# Patient Record
Sex: Male | Born: 1940 | Race: White | Hispanic: No | Smoking: Never smoker
Health system: Southern US, Community
[De-identification: ages and names within clinical notes are randomized; demographics above are authoritative.]

## PROBLEM LIST (undated history)

## (undated) DIAGNOSIS — K219 Gastro-esophageal reflux disease without esophagitis: Secondary | ICD-10-CM

## (undated) DIAGNOSIS — N200 Calculus of kidney: Secondary | ICD-10-CM

## (undated) DIAGNOSIS — Z87442 Personal history of urinary calculi: Secondary | ICD-10-CM

## (undated) DIAGNOSIS — E785 Hyperlipidemia, unspecified: Secondary | ICD-10-CM

## (undated) DIAGNOSIS — K449 Diaphragmatic hernia without obstruction or gangrene: Secondary | ICD-10-CM

## (undated) HISTORY — PX: ORIF TIBIA & FIBULA FRACTURES: SHX2131

---

## 2000-06-01 ENCOUNTER — Emergency Department (HOSPITAL_COMMUNITY): Admission: EM | Admit: 2000-06-01 | Discharge: 2000-06-01 | Payer: Self-pay | Admitting: Emergency Medicine

## 2001-05-22 ENCOUNTER — Ambulatory Visit (HOSPITAL_COMMUNITY): Admission: RE | Admit: 2001-05-22 | Discharge: 2001-05-22 | Payer: Self-pay | Admitting: Gastroenterology

## 2002-03-13 ENCOUNTER — Encounter: Payer: Self-pay | Admitting: Family Medicine

## 2002-03-13 ENCOUNTER — Encounter: Admission: RE | Admit: 2002-03-13 | Discharge: 2002-03-13 | Payer: Self-pay | Admitting: Family Medicine

## 2002-09-13 ENCOUNTER — Encounter: Payer: Self-pay | Admitting: Family Medicine

## 2002-09-13 ENCOUNTER — Encounter: Admission: RE | Admit: 2002-09-13 | Discharge: 2002-09-13 | Payer: Self-pay | Admitting: Family Medicine

## 2004-06-07 ENCOUNTER — Ambulatory Visit (HOSPITAL_COMMUNITY): Admission: RE | Admit: 2004-06-07 | Discharge: 2004-06-07 | Payer: Self-pay | Admitting: *Deleted

## 2005-09-16 ENCOUNTER — Encounter: Admission: RE | Admit: 2005-09-16 | Discharge: 2005-09-16 | Payer: Self-pay | Admitting: Gastroenterology

## 2006-03-21 ENCOUNTER — Encounter: Admission: RE | Admit: 2006-03-21 | Discharge: 2006-03-21 | Payer: Self-pay | Admitting: Gastroenterology

## 2011-01-16 ENCOUNTER — Emergency Department (HOSPITAL_COMMUNITY): Payer: Medicare Other

## 2011-01-16 ENCOUNTER — Emergency Department (HOSPITAL_COMMUNITY)
Admission: EM | Admit: 2011-01-16 | Discharge: 2011-01-16 | Disposition: A | Discharge status: Home / Self Care | Payer: Medicare Other | Attending: Emergency Medicine | Admitting: Emergency Medicine

## 2011-01-16 ENCOUNTER — Encounter: Payer: Self-pay | Admitting: *Deleted

## 2011-01-16 DIAGNOSIS — N200 Calculus of kidney: Secondary | ICD-10-CM | POA: Insufficient documentation

## 2011-01-16 DIAGNOSIS — Z79899 Other long term (current) drug therapy: Secondary | ICD-10-CM | POA: Insufficient documentation

## 2011-01-16 DIAGNOSIS — N201 Calculus of ureter: Secondary | ICD-10-CM | POA: Insufficient documentation

## 2011-01-16 DIAGNOSIS — K219 Gastro-esophageal reflux disease without esophagitis: Secondary | ICD-10-CM | POA: Insufficient documentation

## 2011-01-16 DIAGNOSIS — E785 Hyperlipidemia, unspecified: Secondary | ICD-10-CM | POA: Insufficient documentation

## 2011-01-16 DIAGNOSIS — R110 Nausea: Secondary | ICD-10-CM | POA: Insufficient documentation

## 2011-01-16 DIAGNOSIS — R109 Unspecified abdominal pain: Secondary | ICD-10-CM | POA: Insufficient documentation

## 2011-01-16 HISTORY — DX: Calculus of kidney: N20.0

## 2011-01-16 HISTORY — DX: Gastro-esophageal reflux disease without esophagitis: K21.9

## 2011-01-16 HISTORY — DX: Hyperlipidemia, unspecified: E78.5

## 2011-01-16 HISTORY — DX: Diaphragmatic hernia without obstruction or gangrene: K44.9

## 2011-01-16 LAB — BASIC METABOLIC PANEL
Calcium: 8.4 mg/dL (ref 8.4–10.5)
GFR calc Af Amer: 80 mL/min — ABNORMAL LOW (ref >90)
GFR calc non Af Amer: 69 mL/min — ABNORMAL LOW (ref >90)
Glucose, Bld: 107 mg/dL — ABNORMAL HIGH (ref 70–99)
Potassium: 4 mEq/L (ref 3.5–5.1)
Sodium: 141 mEq/L (ref 135–145)

## 2011-01-16 LAB — URINALYSIS, ROUTINE W REFLEX MICROSCOPIC
Glucose, UA: NEGATIVE mg/dL
Specific Gravity, Urine: 1.029 (ref 1.005–1.030)
pH: 5.5 (ref 5.0–8.0)

## 2011-01-16 LAB — URINE MICROSCOPIC-ADD ON

## 2011-01-16 MED ORDER — OXYCODONE-ACETAMINOPHEN 5-325 MG PO TABS: 1.0000 | ORAL_TABLET | ORAL | Status: AC | PRN

## 2011-01-16 MED ORDER — SODIUM CHLORIDE 0.9 % IV BOLUS (SEPSIS)
1000.0000 mL | Freq: Once | INTRAVENOUS | Status: AC
  Administered 2011-01-16: 1000 mL via INTRAVENOUS

## 2011-01-16 MED ORDER — HYDROMORPHONE HCL PF 1 MG/ML IJ SOLN
1.0000 mg | Freq: Once | INTRAMUSCULAR | Status: AC
  Administered 2011-01-16: 1 mg via INTRAVENOUS
  Filled 2011-01-16: qty 1

## 2011-01-16 MED ORDER — TAMSULOSIN HCL 0.4 MG PO CAPS: 0.4000 mg | ORAL_CAPSULE | Freq: Every day | ORAL | Status: DC

## 2011-01-16 MED ORDER — IBUPROFEN 400 MG PO TABS: 400.0000 mg | ORAL_TABLET | Freq: Three times a day (TID) | ORAL | Status: AC | PRN

## 2011-01-16 MED ORDER — KETOROLAC TROMETHAMINE 30 MG/ML IJ SOLN
30.0000 mg | Freq: Once | INTRAMUSCULAR | Status: AC
  Administered 2011-01-16: 30 mg via INTRAVENOUS
  Filled 2011-01-16: qty 1

## 2011-01-16 NOTE — ED Notes (Signed)
Flank pain that radiates to his groin. Hx of kidney stones...feels like he has same pain.

## 2011-01-16 NOTE — ED Notes (Signed)
Patient transported to CT 

## 2011-01-16 NOTE — ED Notes (Signed)
Presents with left lower quad pain radiating into groin x several hours. Denies urinary sx. Hx of kidney stones.

## 2011-01-16 NOTE — ED Provider Notes (Signed)
History     CSN: [id number] Arrival date & time: 01/16/2011 11:44 AM   First MD Initiated Contact with Patient 01/16/11 1320      Chief Complaint  Patient presents with  . Flank Pain    (Consider location/radiation/quality/duration/timing/severity/associated sxs/prior treatment) Patient is a 70 y.o. male presenting with flank pain. The history is provided by the patient.  Flank Pain   patient reports acute onset colicky left-sided abdominal pain.  He reports that radiates up to his left flank and down into his left groin.  Reports approximately 10 years ago he had a right-sided kidney stone is very similar to this.  This occurred abruptly today.  He's had no trauma.  Reports nausea without vomiting.  Denies diarrhea.  He has no known history per the patient of the abdominal aortic aneurysm.  His had no lightheadedness or syncope.  He denies shortness of breath or cough.  He denies fevers or chills.  He denies dysuria or urinary frequency.  Nothing worsens the symptoms.  Nothing improves the symptoms.  Symptoms are severe.  Past Medical History  Diagnosis Date  . Kidney calculi   . Hyperlipemia   . Hiatal hernia   . GERD (gastroesophageal reflux disease)     Past Surgical History  Procedure Date  . Orif tibia & fibula fractures     History reviewed. No pertinent family history.  History  Substance Use Topics  . Smoking status: Never Smoker   . Smokeless tobacco: Not on file  . Alcohol Use: No      Review of Systems  Genitourinary: Positive for flank pain.  All other systems reviewed and are negative.    Allergies  Review of patient's allergies indicates no known allergies.  Home Medications   Current Outpatient Rx  Name Route Sig Dispense Refill  . BC HEADACHE POWDER PO Oral Take 1 packet by mouth every morning.      Marland Kitchen VITAMIN B-12 PO Oral Take 1 tablet by mouth daily.      Carma Leaven M PLUS PO TABS Oral Take 1 tablet by mouth daily.      Marland Kitchen OMEPRAZOLE 40 MG PO  CPDR Oral Take 40 mg by mouth daily.      . RED YEAST RICE PO Oral Take 1 tablet by mouth daily.      Marland Kitchen SIMVASTATIN 80 MG PO TABS Oral Take 80 mg by mouth at bedtime.        BP 184/95  Pulse 71  Temp(Src) 98 F (36.7 C) (Oral)  Resp 18  SpO2 98%  Physical Exam  Nursing note and vitals reviewed. Constitutional: He is oriented to person, place, and time. He appears well-developed and well-nourished.  HENT:  Head: Normocephalic and atraumatic.  Eyes: EOM are normal.  Neck: Normal range of motion.  Cardiovascular: Normal rate, regular rhythm, normal heart sounds and intact distal pulses.   Pulmonary/Chest: Effort normal and breath sounds normal. No respiratory distress.  Abdominal: Soft. He exhibits no distension. There is no tenderness.       No palpable abdominal mass or pulsatile mass  Musculoskeletal: Normal range of motion.       No CVA tenderness  Neurological: He is alert and oriented to person, place, and time.  Skin: Skin is warm and dry.  Psychiatric: He has a normal mood and affect. Judgment normal.    ED Course  Procedures (including critical care time)  Labs Reviewed  URINALYSIS, ROUTINE W REFLEX MICROSCOPIC - Abnormal; Notable for the following:  Appearance HAZY (*)    Hgb urine dipstick LARGE (*)    Bilirubin Urine SMALL (*)    All other components within normal limits  URINE MICROSCOPIC-ADD ON  BASIC METABOLIC PANEL   Ct Abdomen Pelvis Wo Contrast  01/16/2011  *RADIOLOGY REPORT*  Clinical Data: Left flank pain  CT ABDOMEN AND PELVIS WITHOUT CONTRAST  Technique:  Multidetector CT imaging of the abdomen and pelvis was performed following the standard protocol without intravenous contrast.  Comparison: None.  Findings: Mild plate-like atelectasis versus scarring at the right lung base.  Unenhanced liver, spleen, pancreas, and adrenal glands are within normal limits.  Gallbladder is unremarkable.  No intrahepatic or extrahepatic ductal dilatation.  3 mm  nonobstructing right upper pole calculus (series 2/image 32). Two small right lower pole cysts, measuring up to 12 mm (series 2/image 38).  No hydronephrosis.  Two 3 mm nonobstructing left upper pole renal calculi (series 2/images 33 and 38). 1.1 cm left lower pole cyst.  No hydronephrosis.  No evidence of bowel obstruction.  Normal appendix.  Colonic diverticulosis, without associated inflammatory changes.  Atherosclerotic calcifications of the abdominal aorta and branch vessels.  No abdominopelvic ascites.  No suspicious abdominopelvic lymphadenopathy.  Prostate is top normal in size, measuring 4.6 cm.  Periureteral stranding surrounding the mid left ureter (series 2/image 55).  2 mm left distal ureteral calculus (series 2/image 79).  No bladder calculi.  Visualized osseous structures are within normal limits.  IMPRESSION: 2 mm calculus in the distal left ureter.  Surrounding periureteral stranding.  No hydronephrosis.  Three additional 3 mm nonobstructing bilateral renal calculi.  Original Report Authenticated By: Charline Bills, M.D.     1. Ureteral stone       MDM  Suspect left-sided ureteral stone.  Pain treated.  CT scan pending at this time.  We'll reevaluate  2:00 PM Left ureteral stone on CT scan.  Patient reports significant improvement in his pain.  Discharge home with urology followup        Lyanne Co, MD 01/16/11 1400

## 2013-10-25 ENCOUNTER — Other Ambulatory Visit: Payer: Self-pay | Admitting: Family Medicine

## 2013-10-25 DIAGNOSIS — N63 Unspecified lump in unspecified breast: Secondary | ICD-10-CM

## 2013-10-30 ENCOUNTER — Ambulatory Visit
Admission: RE | Admit: 2013-10-30 | Discharge: 2013-10-30 | Disposition: A | Discharge status: Home / Self Care | Payer: Medicare Other | Source: Ambulatory Visit | Attending: Family Medicine | Admitting: Family Medicine

## 2013-10-30 DIAGNOSIS — N63 Unspecified lump in unspecified breast: Secondary | ICD-10-CM

## 2013-11-07 ENCOUNTER — Ambulatory Visit
Admission: RE | Admit: 2013-11-07 | Discharge: 2013-11-07 | Disposition: A | Discharge status: Home / Self Care | Payer: Medicare Other | Source: Ambulatory Visit | Attending: Family Medicine | Admitting: Family Medicine

## 2013-11-07 ENCOUNTER — Other Ambulatory Visit: Payer: Self-pay | Admitting: Family Medicine

## 2013-11-07 DIAGNOSIS — R05 Cough: Secondary | ICD-10-CM

## 2013-11-07 DIAGNOSIS — R059 Cough, unspecified: Secondary | ICD-10-CM

## 2014-11-14 ENCOUNTER — Other Ambulatory Visit: Payer: Self-pay | Admitting: Gastroenterology

## 2019-07-02 ENCOUNTER — Other Ambulatory Visit: Payer: Self-pay

## 2019-07-02 ENCOUNTER — Emergency Department (HOSPITAL_COMMUNITY): Payer: Medicare Other

## 2019-07-02 ENCOUNTER — Encounter (HOSPITAL_COMMUNITY): Payer: Self-pay

## 2019-07-02 ENCOUNTER — Emergency Department (HOSPITAL_COMMUNITY)
Admission: EM | Admit: 2019-07-02 | Discharge: 2019-07-02 | Disposition: A | Discharge status: Home / Self Care | Payer: Medicare Other | Attending: Emergency Medicine | Admitting: Emergency Medicine

## 2019-07-02 DIAGNOSIS — Z79899 Other long term (current) drug therapy: Secondary | ICD-10-CM | POA: Insufficient documentation

## 2019-07-02 DIAGNOSIS — Z23 Encounter for immunization: Secondary | ICD-10-CM | POA: Insufficient documentation

## 2019-07-02 DIAGNOSIS — S31113A Laceration without foreign body of abdominal wall, right lower quadrant without penetration into peritoneal cavity, initial encounter: Secondary | ICD-10-CM | POA: Insufficient documentation

## 2019-07-02 DIAGNOSIS — Y93E1 Activity, personal bathing and showering: Secondary | ICD-10-CM | POA: Insufficient documentation

## 2019-07-02 DIAGNOSIS — S31119A Laceration without foreign body of abdominal wall, unspecified quadrant without penetration into peritoneal cavity, initial encounter: Secondary | ICD-10-CM

## 2019-07-02 DIAGNOSIS — Y999 Unspecified external cause status: Secondary | ICD-10-CM | POA: Diagnosis not present

## 2019-07-02 DIAGNOSIS — W01198A Fall on same level from slipping, tripping and stumbling with subsequent striking against other object, initial encounter: Secondary | ICD-10-CM | POA: Diagnosis not present

## 2019-07-02 DIAGNOSIS — Y92012 Bathroom of single-family (private) house as the place of occurrence of the external cause: Secondary | ICD-10-CM | POA: Diagnosis not present

## 2019-07-02 LAB — SAMPLE TO BLOOD BANK

## 2019-07-02 LAB — CBC WITH DIFFERENTIAL/PLATELET
Abs Immature Granulocytes: 0.01 10*3/uL (ref 0.00–0.07)
Basophils Absolute: 0 10*3/uL (ref 0.0–0.1)
Basophils Relative: 1 %
Eosinophils Absolute: 0.3 10*3/uL (ref 0.0–0.5)
Eosinophils Relative: 7 %
HCT: 41.3 % (ref 39.0–52.0)
Hemoglobin: 13.5 g/dL (ref 13.0–17.0)
Immature Granulocytes: 0 %
Lymphocytes Relative: 19 %
Lymphs Abs: 1 10*3/uL (ref 0.7–4.0)
MCH: 33.6 pg (ref 26.0–34.0)
MCHC: 32.7 g/dL (ref 30.0–36.0)
MCV: 102.7 fL — ABNORMAL HIGH (ref 80.0–100.0)
Monocytes Absolute: 0.4 10*3/uL (ref 0.1–1.0)
Monocytes Relative: 7 %
Neutro Abs: 3.3 10*3/uL (ref 1.7–7.7)
Neutrophils Relative %: 66 %
Platelets: 184 10*3/uL (ref 150–400)
RBC: 4.02 MIL/uL — ABNORMAL LOW (ref 4.22–5.81)
RDW: 13.9 % (ref 11.5–15.5)
WBC: 5 10*3/uL (ref 4.0–10.5)
nRBC: 0 % (ref 0.0–0.2)

## 2019-07-02 LAB — PROTIME-INR
INR: 1 (ref 0.8–1.2)
Prothrombin Time: 12.3 seconds (ref 11.4–15.2)

## 2019-07-02 LAB — URINALYSIS, ROUTINE W REFLEX MICROSCOPIC
Bilirubin Urine: NEGATIVE
Glucose, UA: NEGATIVE mg/dL
Hgb urine dipstick: NEGATIVE
Ketones, ur: NEGATIVE mg/dL
Leukocytes,Ua: NEGATIVE
Nitrite: NEGATIVE
Protein, ur: NEGATIVE mg/dL
Specific Gravity, Urine: 1.04 — ABNORMAL HIGH (ref 1.005–1.030)
pH: 5 (ref 5.0–8.0)

## 2019-07-02 LAB — I-STAT CHEM 8, ED
BUN: 25 mg/dL — ABNORMAL HIGH (ref 8–23)
Calcium, Ion: 1.15 mmol/L (ref 1.15–1.40)
Chloride: 110 mmol/L (ref 98–111)
Creatinine, Ser: 1.3 mg/dL — ABNORMAL HIGH (ref 0.61–1.24)
Glucose, Bld: 115 mg/dL — ABNORMAL HIGH (ref 70–99)
HCT: 40 % (ref 39.0–52.0)
Hemoglobin: 13.6 g/dL (ref 13.0–17.0)
Potassium: 3.5 mmol/L (ref 3.5–5.1)
Sodium: 144 mmol/L (ref 135–145)
TCO2: 24 mmol/L (ref 22–32)

## 2019-07-02 LAB — COMPREHENSIVE METABOLIC PANEL
ALT: 23 U/L (ref 0–44)
AST: 22 U/L (ref 15–41)
Albumin: 3.7 g/dL (ref 3.5–5.0)
Alkaline Phosphatase: 57 U/L (ref 38–126)
Anion gap: 7 (ref 5–15)
BUN: 20 mg/dL (ref 8–23)
CO2: 21 mmol/L — ABNORMAL LOW (ref 22–32)
Calcium: 9 mg/dL (ref 8.9–10.3)
Chloride: 113 mmol/L — ABNORMAL HIGH (ref 98–111)
Creatinine, Ser: 1.28 mg/dL — ABNORMAL HIGH (ref 0.61–1.24)
GFR calc Af Amer: >60 mL/min (ref >60)
GFR calc non Af Amer: 54 mL/min — ABNORMAL LOW (ref >60)
Glucose, Bld: 123 mg/dL — ABNORMAL HIGH (ref 70–99)
Potassium: 3.4 mmol/L — ABNORMAL LOW (ref 3.5–5.1)
Sodium: 141 mmol/L (ref 135–145)
Total Bilirubin: 0.4 mg/dL (ref 0.3–1.2)
Total Protein: 6.3 g/dL — ABNORMAL LOW (ref 6.5–8.1)

## 2019-07-02 MED ORDER — TETANUS-DIPHTH-ACELL PERTUSSIS 5-2.5-18.5 LF-MCG/0.5 IM SUSP
0.5000 mL | Freq: Once | INTRAMUSCULAR | Status: AC
  Administered 2019-07-02: 01:00:00 0.5 mL via INTRAMUSCULAR
  Filled 2019-07-02: qty 0.5

## 2019-07-02 MED ORDER — LIDOCAINE-EPINEPHRINE (PF) 2 %-1:200000 IJ SOLN
10.0000 mL | Freq: Once | INTRAMUSCULAR | Status: AC
  Administered 2019-07-02: 10 mL via INTRADERMAL
  Filled 2019-07-02: qty 20

## 2019-07-02 MED ORDER — HYDROCODONE-ACETAMINOPHEN 5-325 MG PO TABS
1.0000 | ORAL_TABLET | Freq: Once | ORAL | Status: AC
  Administered 2019-07-02: 04:00:00 1 via ORAL
  Filled 2019-07-02: qty 1

## 2019-07-02 MED ORDER — SODIUM CHLORIDE 0.9 % IV SOLN: INTRAVENOUS | Status: DC

## 2019-07-02 MED ORDER — IOHEXOL 300 MG/ML  SOLN
100.0000 mL | Freq: Once | INTRAMUSCULAR | Status: AC | PRN
  Administered 2019-07-02: 100 mL via INTRAVENOUS

## 2019-07-02 MED ORDER — ONDANSETRON 4 MG PO TBDP
4.0000 mg | ORAL_TABLET | Freq: Once | ORAL | Status: AC
  Administered 2019-07-02: 04:00:00 4 mg via ORAL
  Filled 2019-07-02: qty 1

## 2019-07-02 NOTE — Progress Notes (Signed)
Orthopedic Tech Progress Note Patient Details:  TYSHEEM ACCARDO [birth year] [id number] Level 2 Trauma  Patient ID: Paul Bauer, male   DOB: 1940/06/22, 79 y.o.   MRN: [id number]   Genelle Bal Clyda Smyth 07/02/2019, 1:23 AM

## 2019-07-02 NOTE — ED Notes (Signed)
Patient given urinal for urine collection.

## 2019-07-02 NOTE — ED Notes (Signed)
Trauma paged to Dr. Elesa Massed per her request

## 2019-07-02 NOTE — ED Notes (Signed)
Urine culture sent with u/a °

## 2019-07-02 NOTE — ED Triage Notes (Signed)
Pt was taking a shower and then fell, has penetrating wound to his R flank from faucet unknown is he hit his head, denies SOB.

## 2019-07-02 NOTE — ED Provider Notes (Signed)
Please see Dr. Jolyn Nap note for full H&P.   LACERATION REPAIR Performed by: Harvie Heck Authorized by: Harvie Heck Consent: Verbal consent obtained. Risks and benefits: risks, benefits and alternatives were discussed Consent given by: patient Patient identity confirmed: provided demographic data Prepped and Draped in normal sterile fashion Wound explored  Laceration Location: R flank/abdomen  Laceration Length: 3 cm  No Foreign Bodies seen or palpated  Anesthesia: local infiltration  Local anesthetic: lidocaine 2% wo epinephrine  Anesthetic total: 5 ml  Irrigation method: pressure irrigation Amount of cleaning: standard  Skin closure: sutures.   Number of sutures: 4  Technique: simple interrupted   Patient tolerance: Patient tolerated the procedure well with no immediate complications.    Cherly Anderson, PA-C 07/02/19 0447    Ward, Layla Maw, DO 07/02/19 [phone]

## 2019-07-02 NOTE — Discharge Instructions (Addendum)
Your CT scan showed no traumatic injury other than a superficial laceration to right abdomen that has been cleaned and repaired.  You do not need to be discharged on antibiotics.  Your tetanus vaccination has been updated today.  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  You will need to follow-up with your primary care doctor in 3 years to have an ultrasound to ensure no significant change with this aneurysm.  You may take Tylenol 1000 mg every 6 hours as needed for pain.  Your sutures will need to be removed in 10 days.  This can be done by your primary care physician or here in the emergency department.  You may clean this wound with warm soap and water gently.  You may apply over-the-counter Neosporin and a bandage to keep it clean and dry.

## 2019-07-02 NOTE — ED Notes (Signed)
Patient verbalized understanding of dc instructions, vss, ambulatory with nad.   

## 2019-07-02 NOTE — ED Provider Notes (Signed)
TIME SEEN: 1:14 AM  CHIEF COMPLAINT: Abdominal injury  HPI: Patient is a 79 year old male with history of hiatal hernia, GERD, hyperlipidemia, kidney stones who presents to the emergency department with right-sided abdominal/flank injury.  States that just prior to arrival he slipped in the shower and hit the right lateral abdomen on the faucet.  States that he cut his abdomen and the faucet was stuck on him and he had to pull it off the wall.  He denies that he hit his head.  He is not on blood thinners.  Unsure of last tetanus vaccination.  No other injury.  Denies significant pain.  No chest pain, shortness of breath or dizziness.  ROS: See HPI Constitutional: no fever  Eyes: no drainage  ENT: no runny nose   Cardiovascular:  no chest pain  Resp: no SOB  GI: no vomiting GU: no dysuria Integumentary: no rash  Allergy: no hives  Musculoskeletal: no leg swelling  Neurological: no slurred speech ROS otherwise negative  PAST MEDICAL HISTORY/PAST SURGICAL HISTORY:  Past Medical History:  Diagnosis Date  . GERD (gastroesophageal reflux disease)   . Hiatal hernia   . Hyperlipemia   . Kidney calculi     MEDICATIONS:  Prior to Admission medications   Medication Sig Start Date End Date Taking? Authorizing Provider  Aspirin-Salicylamide-Caffeine (BC HEADACHE POWDER PO) Take 1 packet by mouth every morning.      [provider]  Cyanocobalamin (VITAMIN B-12 PO) Take 1 tablet by mouth daily.      [provider]  Multiple Vitamins-Minerals (MULTIVITAMINS THER. W/MINERALS) TABS Take 1 tablet by mouth daily.      [provider]  omeprazole (PRILOSEC) 40 MG capsule Take 40 mg by mouth daily.      [provider]  Red Yeast Rice Extract (RED YEAST RICE PO) Take 1 tablet by mouth daily.      [provider]  simvastatin (ZOCOR) 80 MG tablet Take 80 mg by mouth at bedtime.      [provider]  Tamsulosin HCl (FLOMAX) 0.4 MG CAPS Take 1  capsule (0.4 mg total) by mouth daily. 01/16/11   Azalia Bilis, MD    ALLERGIES:  No Known Allergies  SOCIAL HISTORY:  Social History   Tobacco Use  . Smoking status: Never Smoker  Substance Use Topics  . Alcohol use: No    FAMILY HISTORY: No family history on file.  EXAM: BP (!) 150/84 (BP Location: Left Arm)   Pulse 78   Temp 98.1 F (36.7 C) (Oral)   Resp 17   SpO2 97%  CONSTITUTIONAL: Alert and oriented and responds appropriately to questions. Well-appearing; well-nourished HEAD: Normocephalic, atraumatic EYES: Conjunctivae clear, pupils appear equal, EOM appear intact ENT: normal nose; moist mucous membranes, normal nose, no dental injury NECK: Supple, normal ROM, no midline spinal tenderness or step-off or deformity CARD: RRR; S1 and S2 appreciated; no murmurs, no clicks, no rubs, no gallops CHEST: Nontender to palpation, no ecchymosis or crepitus or deformity RESP: Normal chest excursion without splinting or tachypnea; breath sounds clear and equal bilaterally; no wheezes, no rhonchi, no rales, no hypoxia or respiratory distress, speaking full sentences ABD/GI: Normal bowel sounds; non-distended; soft, patient has an approximately 3 cm open wound to the right flank.  When I probed this wound it seems to be approximately 1 cm deep but does not appear to go past the musculature.  There are no retained foreign bodies and no significant bleeding at this time.  No ecchymosis. BACK:  The back appears normal no midline spinal tenderness, step-off or deformity EXT: Normal ROM in all joints; no deformity noted, no edema; no cyanosis, extremities are nontender to palpation SKIN: Normal color for age and race; warm; no rash on exposed skin NEURO: Moves all extremities equally, normal speech, no facial asymmetry, normal sensation diffusely PSYCH: The patient's mood and manner are appropriate.   MEDICAL DECISION MAKING: Patient here with penetrating wound to the right  abdomen/flank.  He is hemodynamically stable currently.  Not on blood thinners.  No other sign of trauma.  I have probed the wound and it seems actually quite superficial however given this is a penetrating wound, will activate this as a level 2 trauma.  Will obtain labs, urine, CT of the abdomen pelvis with contrast.  He declines pain medication.  We will continue to closely monitor patient for signs of instability, pain.  Will update tetanus vaccination.  ED PROGRESS: Labs reviewed/interpreted and are reassuring.  Normal hemoglobin.  Creatinine minimally elevated at 1.28.  Last that we have for comparison was 1.07 in 2012.  He is receiving gentle IV hydration here.  No active bleeding.  Still hemodynamically stable with a benign abdomen.  CT of the abdomen pelvis reviewed/interpreted and shows laceration and soft tissue gas and stranding across the right flank compatible with his penetrating injury but there is no evidence of direct pleural or peritoneal violation.  No other acute traumatic findings within the abdomen pelvis.  Incidentally he does have diverticulosis without diverticulitis.  He also has bilateral nonobstructive nephrolithiasis.  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  Recommend follow-up in 3 years for this.  Have referred him to his primary care physician.  Will clean and repair patient's wound at bedside prior to discharge.  4:45 AM  Pt's urine reviewed/interpreted and shows no acute abnormality.  Wound has been cleaned and repaired.  Will discharge him with instructions to use Tylenol as needed for pain.  Wife here to drive him home.  At this time, I do not feel there is any life-threatening condition present. I have reviewed, interpreted and discussed all results (EKG, imaging, lab, urine as appropriate) and exam findings with patient/family. I have reviewed nursing notes and appropriate previous records.  I feel the  patient is safe to be discharged home without further emergent workup and can continue workup as an outpatient as needed. Discussed usual and customary return precautions. Patient/family verbalize understanding and are comfortable with this plan.  Outpatient follow-up has been provided as needed. All questions have been answered.   CRITICAL CARE Performed by: Rochele Raring   Total critical care time: 45 minutes  Critical care time was exclusive of separately billable procedures and treating other patients.  Critical care was necessary to treat or prevent imminent or life-threatening deterioration.  Critical care was time spent personally by me on the following activities: development of treatment plan with patient and/or surrogate as well as nursing, discussions with consultants, evaluation of patient's response to treatment, examination of patient, obtaining history from patient or surrogate, ordering and performing treatments and interventions, ordering and review of laboratory studies, ordering and review of radiographic studies, pulse oximetry and re-evaluation of patient's condition.   JHORDAN MCKIBBEN was evaluated in Emergency Department on 07/02/2019 for the symptoms described in the history of present illness. He was evaluated in the context of the global COVID-19 pandemic, which necessitated consideration that  the patient might be at risk for infection with the SARS-CoV-2 virus that causes COVID-19. Institutional protocols and algorithms that pertain to the evaluation of patients at risk for COVID-19 are in a state of rapid change based on information released by regulatory bodies including the CDC and federal and state organizations. These policies and algorithms were followed during the patient's care in the ED.      Jud Fanguy, Delice Bison, DO 07/02/19 [phone]

## 2020-06-03 DIAGNOSIS — H6123 Impacted cerumen, bilateral: Secondary | ICD-10-CM | POA: Diagnosis not present

## 2020-06-03 DIAGNOSIS — Z570 Occupational exposure to noise: Secondary | ICD-10-CM | POA: Diagnosis not present

## 2020-06-03 DIAGNOSIS — H906 Mixed conductive and sensorineural hearing loss, bilateral: Secondary | ICD-10-CM | POA: Diagnosis not present

## 2020-11-10 DIAGNOSIS — Z79899 Other long term (current) drug therapy: Secondary | ICD-10-CM | POA: Diagnosis not present

## 2020-11-10 DIAGNOSIS — D692 Other nonthrombocytopenic purpura: Secondary | ICD-10-CM | POA: Diagnosis not present

## 2020-11-10 DIAGNOSIS — K219 Gastro-esophageal reflux disease without esophagitis: Secondary | ICD-10-CM | POA: Diagnosis not present

## 2020-11-10 DIAGNOSIS — E7800 Pure hypercholesterolemia, unspecified: Secondary | ICD-10-CM | POA: Diagnosis not present

## 2020-11-10 DIAGNOSIS — Z23 Encounter for immunization: Secondary | ICD-10-CM | POA: Diagnosis not present

## 2020-11-10 DIAGNOSIS — N1831 Chronic kidney disease, stage 3a: Secondary | ICD-10-CM | POA: Diagnosis not present

## 2020-11-10 DIAGNOSIS — Z1389 Encounter for screening for other disorder: Secondary | ICD-10-CM | POA: Diagnosis not present

## 2020-11-10 DIAGNOSIS — Z0000 Encounter for general adult medical examination without abnormal findings: Secondary | ICD-10-CM | POA: Diagnosis not present

## 2020-11-10 DIAGNOSIS — Z86010 Personal history of colonic polyps: Secondary | ICD-10-CM | POA: Diagnosis not present

## 2020-11-10 DIAGNOSIS — I700 Atherosclerosis of aorta: Secondary | ICD-10-CM | POA: Diagnosis not present

## 2021-08-25 IMAGING — CT CT ABD-PELV W/ CM
2 of 5 series · 14 of 46 positions shown, 16 images · IV contrast (omnipaque)
Comparison: CT 01/16/2011

CLINICAL DATA: Fall in shower, penetrating wound to the right
abdomen/flank

EXAM:
CT ABDOMEN AND PELVIS WITH CONTRAST
TECHNIQUE: Multidetector CT imaging of the abdomen and pelvis was performed
using the standard protocol following bolus administration of
intravenous contrast.
CONTRAST:  100mL OMNIPAQUE IOHEXOL 300 MG/ML  SOLN

[Series 3: a/p w/ 5mm · axial · 0.71mm/px · z∈[+868,+1293]mm · 11 of 97 slices shown, 13 images]
[im 6/97  soft-tissue]
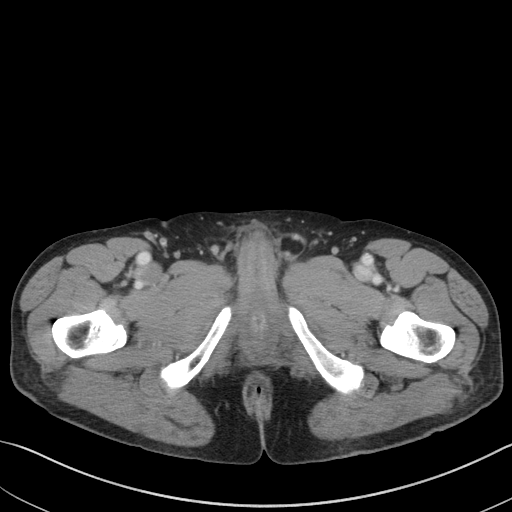
[im 6/97  bone]
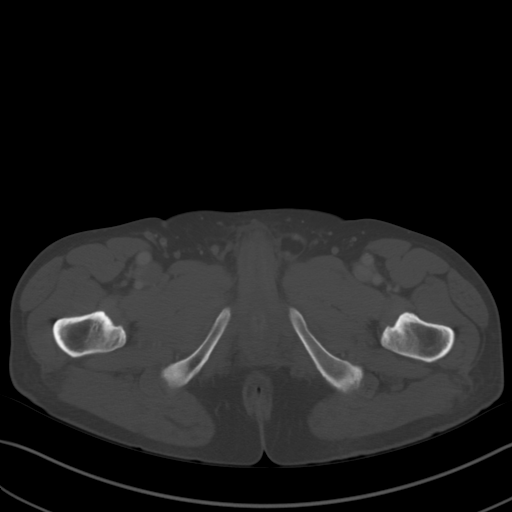
[im 17/97  soft-tissue]
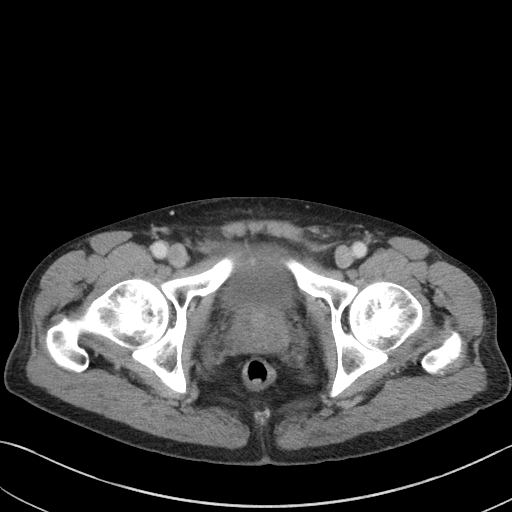
[im 22/97  soft-tissue]
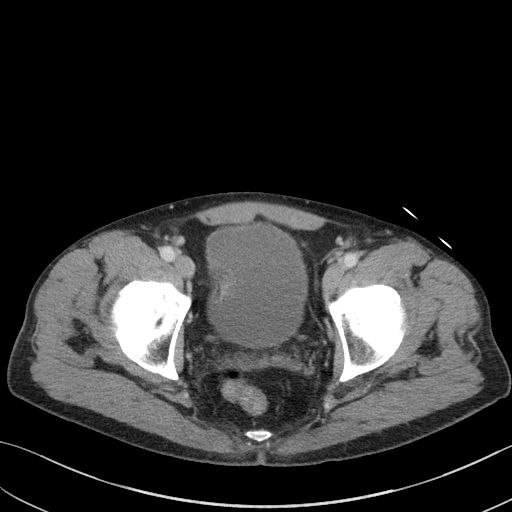
[im 33/97  soft-tissue]
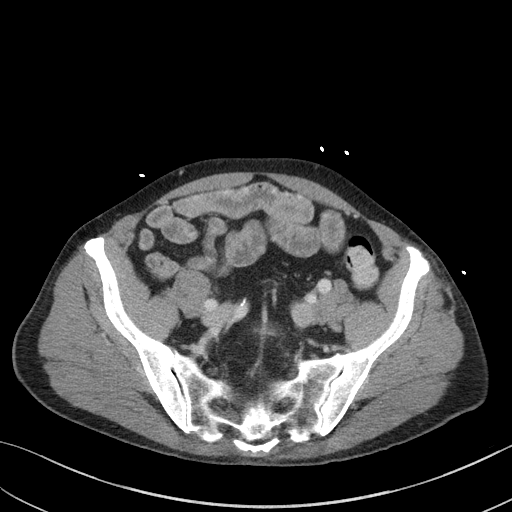
[im 38/97  soft-tissue]
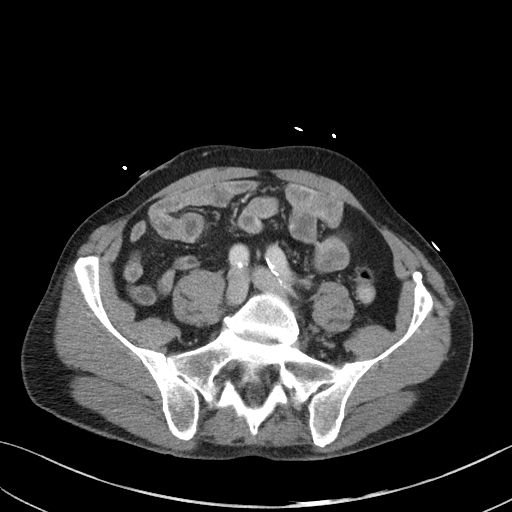
[im 49/97  soft-tissue]
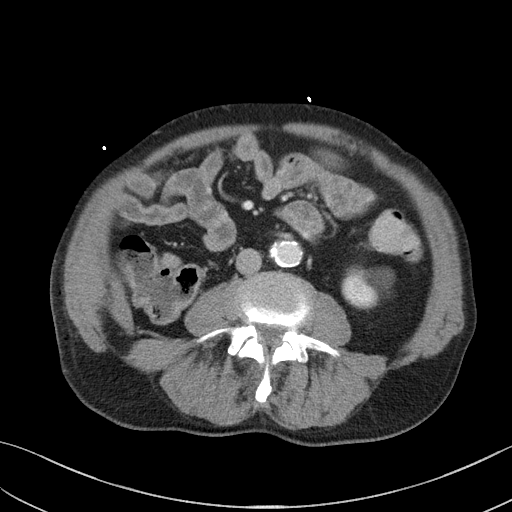
[im 59/97  soft-tissue]
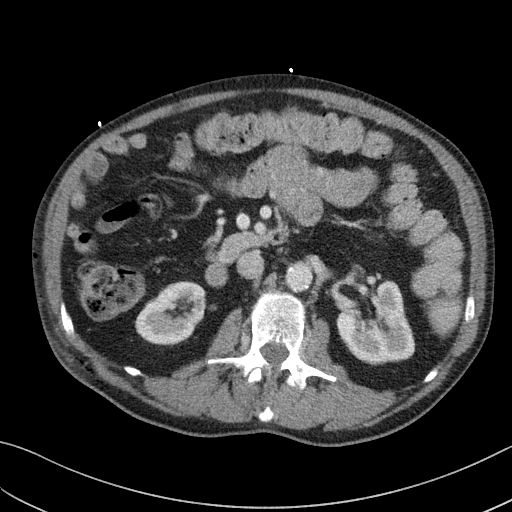
[im 65/97  soft-tissue]
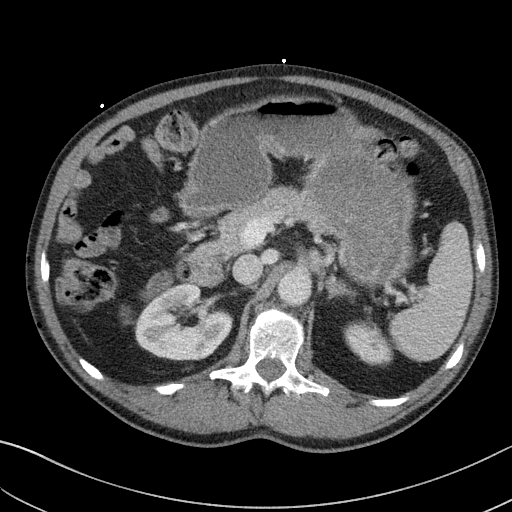
[im 75/97  soft-tissue]
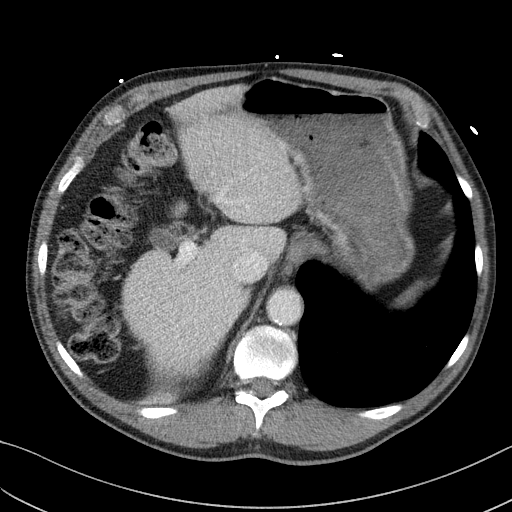
[im 75/97  bone]
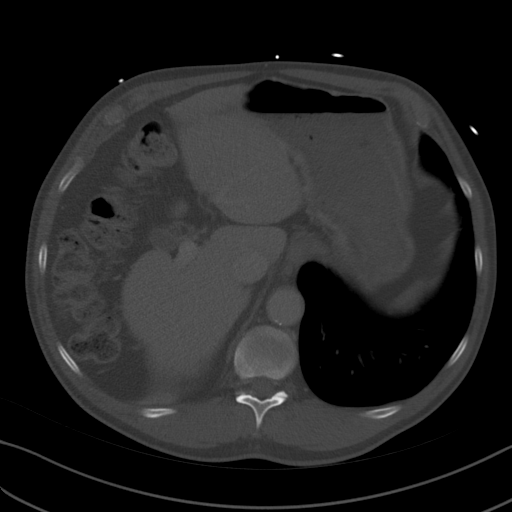
[im 81/97  soft-tissue]
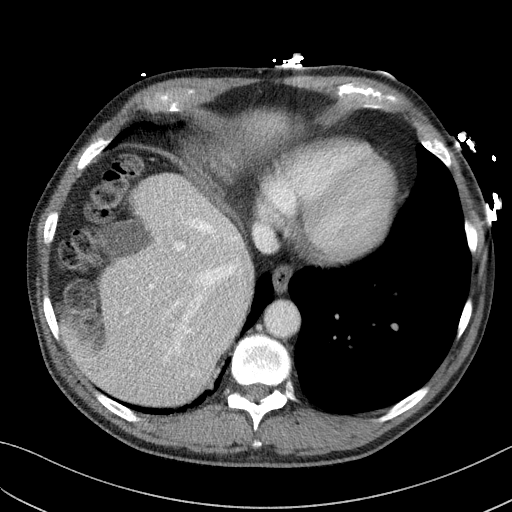
[im 91/97  soft-tissue]
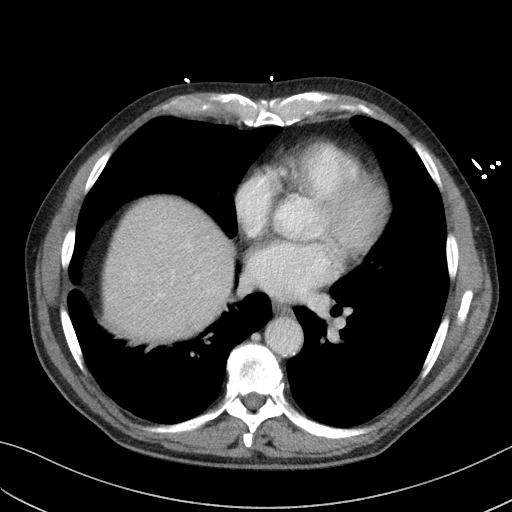

[Series 6: a/p w/ cor · coronal · 0.75mm/px · 3 of 144 slices shown]
[im 48/144  soft-tissue]
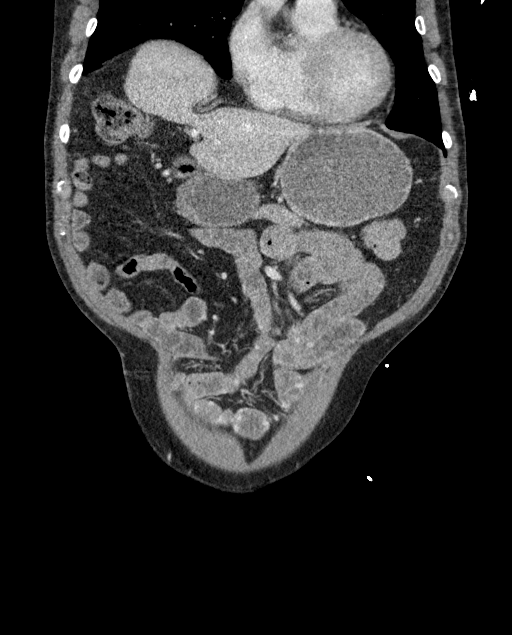
[im 64/144  soft-tissue]
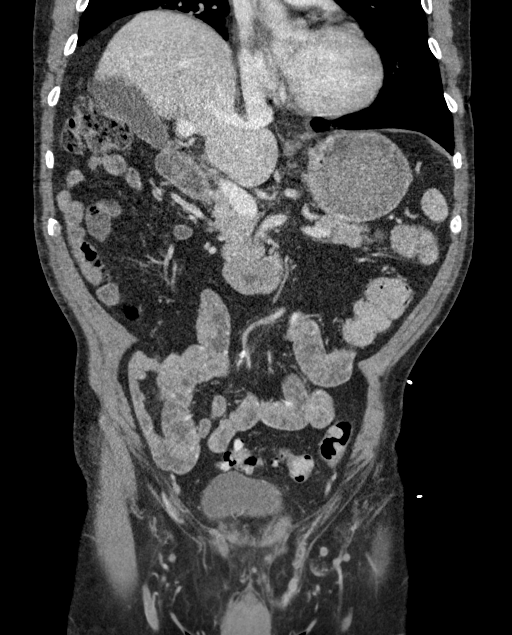
[im 80/144  soft-tissue]
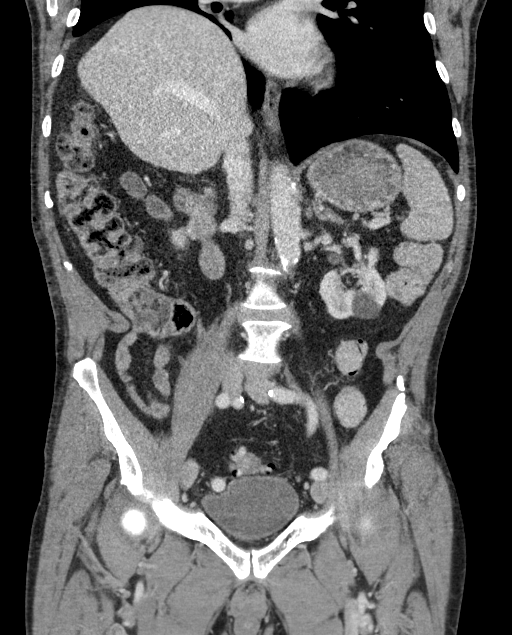

[14 of 46 positions shown; findings below may reference images not displayed]

FINDINGS: Lower chest: Chronic elevation the right hemidiaphragm with adjacent
atelectatic changes in the right lung base. Lung bases are otherwise
clear. Normal heart size. No pericardial effusion.

Hepatobiliary: Slightly altered configuration of the liver related
to the hemidiaphragmatic elevation. No direct hepatic injury or
perihepatic hematoma. No worrisome focal liver lesions. Smooth liver
surface contour. Normal hepatic attenuation. Normal gallbladder. No
visible calcified gallstones or biliary ductal dilatation.

Pancreas: No direct pancreatic injury. No peripancreatic
inflammation or ductal dilatation.

Spleen: No direct splenic injury or perisplenic hematoma. Normal
splenic size. No concerning splenic lesions.

Adrenals/Urinary Tract: No adrenal hematoma or suspicious adrenal
lesions. No direct renal injury or perinephric hemorrhage. Kidneys
enhance and excrete symmetrically without extravasation of contrast
from the collecting system on excretory phase delayed imaging.
Slightly high positioning of the right kidney relative to the left
is likely related to some hemidiaphragmatic elevation. Fluid
attenuation cyst arises from the interpolar right kidney measuring
2.2 cm (3/35) with a second fluid attenuation cyst arising from the
lower pole left kidney measuring 2.8 cm (3/49). No concerning renal
masses. Bilateral nonobstructive nephrolithiasis is seen. No
obstructing urolithiasis or hydronephrosis. No evidence of direct
bladder injury or other acute bladder abnormality. Indentation of
the bladder base by the enlarged prostate.

Stomach/Bowel: Distal esophagus is unremarkable. There is moderate
distention of the stomach with ingested material. Duodenum is
unremarkable. No focal large or small bowel thickening or dilatation
to suggest direct bowel injury. Scattered colonic diverticula
without focal pericolonic inflammation to suggest diverticulitis. No
direct mesenteric contusive change or hematoma.

Vascular/Lymphatic: Atherosclerotic plaque present throughout the
aorta and branch vessels. There is an infrarenal abdominal aortic
aneurysm measuring up to 3.3 cm with some calcified and noncalcified
mural plaque. This arises 1.1 cm below the left renal artery origin.
Finding is new since 8048. No periaortic stranding or hemorrhage to
suggest acute injury or threatened rupture. Major venous structures
are unremarkable.

Reproductive: Borderline enlarged prostate with some coarse
eccentric calcifications.

Other: Soft tissue defect with subcutaneous fat stranding and
subcutaneous gas in the superficial soft tissues across the right
flank (3/43). No significant intramuscular gas. No intramuscular
hemorrhage. No evidence of direct violation of the peritoneal
cavity. No subjacent rib fracture or osseous injury. No deeper
visceral or vascular injuries.

Bilateral fat containing inguinal hernias. No bowel containing
hernias. No abdominopelvic free fluid or air.

Musculoskeletal: Laceration and soft tissue injury across the right
flank, as above. No visible displaced rib fracture or other acute
osseous abnormality in the site of direct trauma or elsewhere in the
abdomen or pelvis. There is levocurvature of the lumbar spine. Mild
degenerative changes in the SI joints and both hips. Enthesopathic
changes along the left iliac crest are similar to prior.
IMPRESSION: 1. Laceration with soft tissue gas and stranding across the right
flank compatible with reported site of penetrating injury by shower
faucet during fall. No evidence of direct pleural or peritoneal
violation. No subjacent or other acute traumatic findings in the
abdomen or pelvis.
2. Bilateral nonobstructive nephrolithiasis.
3. Colonic diverticulosis without evidence of diverticulitis.
4. Chronic elevation the right hemidiaphragm with adjacent
atelectatic changes in the right lung base.
5. Aortic Atherosclerosis (H1UE1-78A.A).
6. 3.3 cm infrarenal abdominal aortic aneurysm without features of
acute aortic syndrome or injury. Recommend followup by ultrasound in
3 years. This recommendation follows ACR consensus guidelines: White
Paper of the ACR Incidental Findings Committee II on Vascular
Findings. [HOSPITAL] 8074; [DATE]. Aortic aneurysm NOS
(H1UE1-7L5.2)

## 2022-04-07 ENCOUNTER — Other Ambulatory Visit: Payer: Self-pay | Admitting: Orthopedic Surgery

## 2022-04-07 DIAGNOSIS — M19011 Primary osteoarthritis, right shoulder: Secondary | ICD-10-CM

## 2022-04-14 ENCOUNTER — Ambulatory Visit
Admission: RE | Admit: 2022-04-14 | Discharge: 2022-04-14 | Disposition: A | Discharge status: Home / Self Care | Payer: Medicare (Managed Care) | Source: Ambulatory Visit | Attending: Orthopedic Surgery | Admitting: Orthopedic Surgery

## 2022-04-14 DIAGNOSIS — M19011 Primary osteoarthritis, right shoulder: Secondary | ICD-10-CM

## 2022-05-09 NOTE — Progress Notes (Signed)
Anesthesia Review:  PCP: Rob ehinger- Clearance 04/08/22 on chart LOV 03/29/22 on chart  Cardiologist : Chest x-ray : EKG : 03/29/22- DR Ehinger  Echo : Stress test: Cardiac Cath :  Activity level:  Sleep Study/ CPAP : Fasting Blood Sugar :      / Checks Blood Sugar -- times a day:   Blood Thinner/ Instructions /Last Dose: ASA / Instructions/ Last Dose :

## 2022-05-09 NOTE — Patient Instructions (Signed)
SURGICAL WAITING ROOM VISITATION  Patients having surgery or a procedure may have no more than 2 support people in the waiting area - these visitors may rotate.    Children under the age of 46 must have an adult with them who is not the patient.  Due to an increase in RSV and influenza rates and associated hospitalizations, children ages 65 and under may not visit patients in [place].  If the patient needs to stay at the hospital during part of their recovery, the visitor guidelines for inpatient rooms apply. Pre-op nurse will coordinate an appropriate time for 1 support person to accompany patient in pre-op.  This support person may not rotate.    Please refer to the [facility] website for the visitor guidelines for Inpatients (after your surgery is over and you are in a regular room).       Your procedure is scheduled on:     05/20/22    Report to [facility] Main Entrance    Report to admitting at  [place] AM   Call this number if you have problems the morning of surgery [phone]   Do not eat food :After Midnight.   After Midnight you may have the following liquids until _ 0645_____ AM DAY OF SURGERY  Water Non-Citrus Juices (without pulp, NO RED-Apple, White grape, White cranberry) Black Coffee (NO MILK/CREAM OR CREAMERS, sugar ok)  Clear Tea (NO MILK/CREAM OR CREAMERS, sugar ok) regular and decaf                             Plain Jell-O (NO RED)                                           Fruit ices (not with fruit pulp, NO RED)                                     Popsicles (NO RED)                                                               Sports drinks like Gatorade (NO RED)         The day of surgery:  Drink ONE (1) Pre-Surgery Clear Ensure or G2 at  0645 AM  ( have completed by ) the morning of surgery. Drink in one sitting. Do not sip.  This drink was given to you during your hospital  pre-op appointment visit. Nothing else to drink after  completing the  Pre-Surgery Clear Ensure or G2.          If you have questions, please contact your surgeon's office.       Oral Hygiene is also important to reduce your risk of infection.                                    Remember - BRUSH YOUR TEETH THE MORNING OF SURGERY WITH YOUR REGULAR TOOTHPASTE  DENTURES WILL BE REMOVED  PRIOR TO SURGERY PLEASE DO NOT APPLY "Poly grip" OR ADHESIVES!!!   Do NOT smoke after Midnight   Take these medicines the morning of surgery with A SIP OF WATER:   DO NOT TAKE ANY ORAL DIABETIC MEDICATIONS DAY OF YOUR SURGERY  Bring CPAP mask and tubing day of surgery.                              You may not have any metal on your body including hair pins, jewelry, and body piercing             Do not wear make-up, lotions, powders, perfumes/cologne, or deodorant  Do not wear nail polish including gel and S&S, artificial/acrylic nails, or any other type of covering on natural nails including finger and toenails. If you have artificial nails, gel coating, etc. that needs to be removed by a nail salon please have this removed prior to surgery or surgery may need to be canceled/ delayed if the surgeon/ anesthesia feels like they are unable to be safely monitored.   Do not shave  48 hours prior to surgery.               Men may shave face and neck.   Do not bring valuables to the hospital. [place].   Contacts, glasses, dentures or bridgework may not be worn into surgery.   Bring small overnight bag day of surgery.   DO NOT [place]. PHARMACY WILL DISPENSE MEDICATIONS LISTED ON YOUR MEDICATION LIST TO YOU DURING YOUR ADMISSION [place]!    Patients discharged on the day of surgery will not be allowed to drive home.  Someone NEEDS to stay with you for the first 24 hours after anesthesia.   Special Instructions: Bring a copy of your healthcare power of attorney and  living will documents the day of surgery if you haven't scanned them before.              Please read over the following fact sheets you were given: IF [place] [phone]   If you received a COVID test during your pre-op visit  it is requested that you wear a mask when out in public, stay away from anyone that may not be feeling well and notify your surgeon if you develop symptoms. If you test positive for Covid or have been in contact with anyone that has tested positive in the last 10 days please notify you surgeon.    [facility] - Preparing for Surgery Before surgery, you can play an important role.  Because skin is not sterile, your skin needs to be as free of germs as possible.  You can reduce the number of germs on your skin by washing with CHG (chlorahexidine gluconate) soap before surgery.  CHG is an antiseptic cleaner which kills germs and bonds with the skin to continue killing germs even after washing. Please DO NOT use if you have an allergy to CHG or antibacterial soaps.  If your skin becomes reddened/irritated stop using the CHG and inform your nurse when you arrive at Short Stay. Do not shave (including legs and underarms) for at least 48 hours prior to the first CHG shower.  You may shave your face/neck. Please follow these instructions  carefully:  1.  Shower with CHG Soap the night before surgery and the  morning of Surgery.  2.  If you choose to wash your hair, wash your hair first as usual with your  normal  shampoo.  3.  After you shampoo, rinse your hair and body thoroughly to remove the  shampoo.                           4.  Use CHG as you would any other liquid soap.  You can apply chg directly  to the skin and wash                       Gently with a scrungie or clean washcloth.  5.  Apply the CHG Soap to your body ONLY FROM THE NECK DOWN.   Do not use on face/ open                           Wound or open sores. Avoid  contact with eyes, ears mouth and genitals (private parts).                       Wash face,  Genitals (private parts) with your normal soap.             6.  Wash thoroughly, paying special attention to the area where your surgery  will be performed.  7.  Thoroughly rinse your body with warm water from the neck down.  8.  DO NOT shower/wash with your normal soap after using and rinsing off  the CHG Soap.                9.  Pat yourself dry with a clean towel.            10.  Wear clean pajamas.            11.  Place clean sheets on your bed the night of your first shower and do not  sleep with pets. Day of Surgery : Do not apply any lotions/deodorants the morning of surgery.  Please wear clean clothes to the hospital/surgery center.  FAILURE TO FOLLOW THESE INSTRUCTIONS MAY RESULT IN THE CANCELLATION OF YOUR SURGERY PATIENT SIGNATURE_________________________________  NURSE SIGNATURE__________________________________  ________________________________________________________________________[facility]- Preparing for Total Shoulder Arthroplasty    Before surgery, you can play an important role. Because skin is not sterile, your skin needs to be as free of germs as possible. You can reduce the number of germs on your skin by using the following products. Benzoyl Peroxide Gel Reduces the number of germs present on the skin Applied twice a day to shoulder area starting two days before surgery    ==================================================================  Please follow these instructions carefully:  BENZOYL PEROXIDE 5% GEL  Please do not use if you have an allergy to benzoyl peroxide.   If your skin becomes reddened/irritated stop using the benzoyl peroxide.  Starting two days before surgery, apply as follows: Apply benzoyl peroxide in the morning and at night. Apply after taking a shower. If you are not taking a shower clean entire shoulder front, back, and side along with the  armpit with a clean wet washcloth.  Place a quarter-sized dollop on your shoulder and rub in thoroughly, making sure to cover the front, back, and side of your shoulder, along with the armpit.   2 days before  ____ AM   ____ PM              1 day before ____ AM   ____ PM                         Do this twice a day for two days.  (Last application is the night before surgery, AFTER using the CHG soap as described below).  Do NOT apply benzoyl peroxide gel on the day of surgery.

## 2022-05-11 ENCOUNTER — Encounter (HOSPITAL_COMMUNITY)
Admission: RE | Admit: 2022-05-11 | Discharge: 2022-05-11 | Disposition: A | Discharge status: Home / Self Care | Payer: Medicare (Managed Care) | Source: Ambulatory Visit | Attending: Orthopedic Surgery | Admitting: Orthopedic Surgery

## 2022-05-11 ENCOUNTER — Encounter (HOSPITAL_COMMUNITY): Payer: Self-pay

## 2022-05-11 ENCOUNTER — Other Ambulatory Visit: Payer: Self-pay

## 2022-05-11 VITALS — BP 143/86 | HR 74 | Temp 98.7°F | Resp 16 | Ht 71.0 in | Wt 156.0 lb

## 2022-05-11 DIAGNOSIS — Z01812 Encounter for preprocedural laboratory examination: Secondary | ICD-10-CM | POA: Insufficient documentation

## 2022-05-11 DIAGNOSIS — Z79899 Other long term (current) drug therapy: Secondary | ICD-10-CM | POA: Diagnosis not present

## 2022-05-11 DIAGNOSIS — Z01818 Encounter for other preprocedural examination: Secondary | ICD-10-CM

## 2022-05-11 HISTORY — DX: Personal history of urinary calculi: Z87.442

## 2022-05-11 LAB — CBC
HCT: 42.4 % (ref 39.0–52.0)
Hemoglobin: 14.3 g/dL (ref 13.0–17.0)
MCH: 33.3 pg (ref 26.0–34.0)
MCHC: 33.7 g/dL (ref 30.0–36.0)
MCV: 98.6 fL (ref 80.0–100.0)
Platelets: 142 10*3/uL — ABNORMAL LOW (ref 150–400)
RBC: 4.3 MIL/uL (ref 4.22–5.81)
RDW: 13.1 % (ref 11.5–15.5)
WBC: 4.7 10*3/uL (ref 4.0–10.5)
nRBC: 0 % (ref 0.0–0.2)

## 2022-05-11 LAB — BASIC METABOLIC PANEL
Anion gap: 8 (ref 5–15)
BUN: 22 mg/dL (ref 8–23)
CO2: 25 mmol/L (ref 22–32)
Calcium: 8.8 mg/dL — ABNORMAL LOW (ref 8.9–10.3)
Chloride: 109 mmol/L (ref 98–111)
Creatinine, Ser: 1.48 mg/dL — ABNORMAL HIGH (ref 0.61–1.24)
GFR, Estimated: 48 mL/min — ABNORMAL LOW (ref >60)
Glucose, Bld: 71 mg/dL (ref 70–99)
Potassium: 4 mmol/L (ref 3.5–5.1)
Sodium: 142 mmol/L (ref 135–145)

## 2022-05-11 LAB — SURGICAL PCR SCREEN
MRSA, PCR: NEGATIVE
Staphylococcus aureus: POSITIVE — AB

## 2022-05-20 ENCOUNTER — Observation Stay (HOSPITAL_COMMUNITY): Payer: Medicare (Managed Care)

## 2022-05-20 ENCOUNTER — Observation Stay (HOSPITAL_COMMUNITY)
Admission: RE | Admit: 2022-05-20 | Discharge: 2022-05-21 | Disposition: A | Discharge status: Home / Self Care | Payer: Medicare (Managed Care) | Attending: Orthopedic Surgery | Admitting: Orthopedic Surgery

## 2022-05-20 ENCOUNTER — Ambulatory Visit (HOSPITAL_BASED_OUTPATIENT_CLINIC_OR_DEPARTMENT_OTHER): Payer: Medicare (Managed Care) | Admitting: Anesthesiology

## 2022-05-20 ENCOUNTER — Encounter (HOSPITAL_COMMUNITY): Payer: Self-pay | Admitting: Orthopedic Surgery

## 2022-05-20 ENCOUNTER — Ambulatory Visit (HOSPITAL_COMMUNITY): Payer: Medicare (Managed Care) | Admitting: Physician Assistant

## 2022-05-20 ENCOUNTER — Other Ambulatory Visit: Payer: Self-pay

## 2022-05-20 ENCOUNTER — Encounter (HOSPITAL_COMMUNITY)
Admission: RE | Disposition: A | Discharge status: Home / Self Care | Payer: Self-pay | Source: Home / Self Care | Attending: Orthopedic Surgery

## 2022-05-20 DIAGNOSIS — M19011 Primary osteoarthritis, right shoulder: Secondary | ICD-10-CM | POA: Diagnosis present

## 2022-05-20 DIAGNOSIS — D696 Thrombocytopenia, unspecified: Secondary | ICD-10-CM | POA: Diagnosis not present

## 2022-05-20 DIAGNOSIS — K219 Gastro-esophageal reflux disease without esophagitis: Secondary | ICD-10-CM | POA: Insufficient documentation

## 2022-05-20 DIAGNOSIS — M75101 Unspecified rotator cuff tear or rupture of right shoulder, not specified as traumatic: Secondary | ICD-10-CM | POA: Insufficient documentation

## 2022-05-20 DIAGNOSIS — E785 Hyperlipidemia, unspecified: Secondary | ICD-10-CM | POA: Insufficient documentation

## 2022-05-20 DIAGNOSIS — K449 Diaphragmatic hernia without obstruction or gangrene: Secondary | ICD-10-CM | POA: Diagnosis not present

## 2022-05-20 DIAGNOSIS — Z9889 Other specified postprocedural states: Secondary | ICD-10-CM | POA: Diagnosis present

## 2022-05-20 DIAGNOSIS — Z01818 Encounter for other preprocedural examination: Secondary | ICD-10-CM

## 2022-05-20 HISTORY — PX: REVERSE SHOULDER ARTHROPLASTY: SHX5054

## 2022-05-20 SURGERY — ARTHROPLASTY, SHOULDER, TOTAL, REVERSE
Anesthesia: General | Site: Shoulder | Laterality: Right

## 2022-05-20 MED ORDER — FENTANYL CITRATE PF 50 MCG/ML IJ SOSY
50.0000 ug | PREFILLED_SYRINGE | INTRAMUSCULAR | Status: AC
  Administered 2022-05-20: 50 ug via INTRAVENOUS
  Filled 2022-05-20: qty 2

## 2022-05-20 MED ORDER — PROPOFOL 10 MG/ML IV BOLUS
INTRAVENOUS | Status: DC | PRN
  Administered 2022-05-20: 120 mg via INTRAVENOUS
  Administered 2022-05-20: 40 mg via INTRAVENOUS

## 2022-05-20 MED ORDER — HYDROCODONE-ACETAMINOPHEN 5-325 MG PO TABS
1.0000 | ORAL_TABLET | ORAL | Status: DC | PRN
  Administered 2022-05-20: 2 via ORAL
  Filled 2022-05-20: qty 2

## 2022-05-20 MED ORDER — STERILE WATER FOR IRRIGATION IR SOLN
Status: DC | PRN
  Administered 2022-05-20: 2000 mL

## 2022-05-20 MED ORDER — MORPHINE SULFATE (PF) 2 MG/ML IV SOLN: 0.5000 mg | INTRAVENOUS | Status: DC | PRN

## 2022-05-20 MED ORDER — METOCLOPRAMIDE HCL 5 MG/ML IJ SOLN: 5.0000 mg | Freq: Three times a day (TID) | INTRAMUSCULAR | Status: DC | PRN

## 2022-05-20 MED ORDER — LACTATED RINGERS IV SOLN: INTRAVENOUS | Status: DC

## 2022-05-20 MED ORDER — DOCUSATE SODIUM 100 MG PO CAPS
100.0000 mg | ORAL_CAPSULE | Freq: Two times a day (BID) | ORAL | Status: DC
  Administered 2022-05-20: 100 mg via ORAL
  Filled 2022-05-20 (×2): qty 1

## 2022-05-20 MED ORDER — MENTHOL 3 MG MT LOZG: 1.0000 | LOZENGE | OROMUCOSAL | Status: DC | PRN

## 2022-05-20 MED ORDER — DEXAMETHASONE SODIUM PHOSPHATE 10 MG/ML IJ SOLN
INTRAMUSCULAR | Status: AC
  Filled 2022-05-20: qty 1

## 2022-05-20 MED ORDER — VANCOMYCIN HCL 1000 MG IV SOLR
INTRAVENOUS | Status: AC
  Filled 2022-05-20: qty 20

## 2022-05-20 MED ORDER — ONDANSETRON HCL 4 MG PO TABS: 4.0000 mg | ORAL_TABLET | Freq: Four times a day (QID) | ORAL | Status: DC | PRN

## 2022-05-20 MED ORDER — ATORVASTATIN CALCIUM 40 MG PO TABS
40.0000 mg | ORAL_TABLET | Freq: Every day | ORAL | Status: DC
  Administered 2022-05-21: 40 mg via ORAL
  Filled 2022-05-20: qty 1

## 2022-05-20 MED ORDER — ACETAMINOPHEN 325 MG PO TABS: 325.0000 mg | ORAL_TABLET | Freq: Four times a day (QID) | ORAL | Status: DC | PRN

## 2022-05-20 MED ORDER — TRANEXAMIC ACID-NACL 1000-0.7 MG/100ML-% IV SOLN
1000.0000 mg | Freq: Once | INTRAVENOUS | Status: AC
  Administered 2022-05-20: 1000 mg via INTRAVENOUS
  Filled 2022-05-20: qty 100

## 2022-05-20 MED ORDER — EPHEDRINE 5 MG/ML INJ
INTRAVENOUS | Status: AC
  Filled 2022-05-20: qty 5

## 2022-05-20 MED ORDER — VANCOMYCIN HCL 1000 MG IV SOLR
INTRAVENOUS | Status: DC | PRN
  Administered 2022-05-20: 1000 mg

## 2022-05-20 MED ORDER — METOCLOPRAMIDE HCL 5 MG PO TABS: 5.0000 mg | ORAL_TABLET | Freq: Three times a day (TID) | ORAL | Status: DC | PRN

## 2022-05-20 MED ORDER — 0.9 % SODIUM CHLORIDE (POUR BTL) OPTIME
TOPICAL | Status: DC | PRN
  Administered 2022-05-20: 1000 mL

## 2022-05-20 MED ORDER — BUPIVACAINE HCL (PF) 0.5 % IJ SOLN
INTRAMUSCULAR | Status: DC | PRN
  Administered 2022-05-20: 15 mL via PERINEURAL

## 2022-05-20 MED ORDER — OXYCODONE HCL 5 MG PO TABS: 5.0000 mg | ORAL_TABLET | Freq: Once | ORAL | Status: DC | PRN

## 2022-05-20 MED ORDER — HYDROMORPHONE HCL 1 MG/ML IJ SOLN: 0.2500 mg | INTRAMUSCULAR | Status: DC | PRN

## 2022-05-20 MED ORDER — CHLORHEXIDINE GLUCONATE 0.12 % MT SOLN
15.0000 mL | Freq: Once | OROMUCOSAL | Status: AC
  Administered 2022-05-20: 15 mL via OROMUCOSAL

## 2022-05-20 MED ORDER — PHENOL 1.4 % MT LIQD: 1.0000 | OROMUCOSAL | Status: DC | PRN

## 2022-05-20 MED ORDER — HYDROCODONE-ACETAMINOPHEN 7.5-325 MG PO TABS: 1.0000 | ORAL_TABLET | ORAL | Status: DC | PRN

## 2022-05-20 MED ORDER — DEXAMETHASONE SODIUM PHOSPHATE 10 MG/ML IJ SOLN
INTRAMUSCULAR | Status: DC | PRN
  Administered 2022-05-20 (×2): 5 mg via INTRAVENOUS

## 2022-05-20 MED ORDER — PHENYLEPHRINE HCL-NACL 20-0.9 MG/250ML-% IV SOLN
INTRAVENOUS | Status: DC | PRN
  Administered 2022-05-20: 25 ug/min via INTRAVENOUS

## 2022-05-20 MED ORDER — MIDAZOLAM HCL 2 MG/2ML IJ SOLN
1.0000 mg | INTRAMUSCULAR | Status: DC
  Filled 2022-05-20: qty 2

## 2022-05-20 MED ORDER — ONDANSETRON HCL 4 MG/2ML IJ SOLN: 4.0000 mg | Freq: Four times a day (QID) | INTRAMUSCULAR | Status: DC | PRN

## 2022-05-20 MED ORDER — ACETAMINOPHEN 500 MG PO TABS
500.0000 mg | ORAL_TABLET | Freq: Four times a day (QID) | ORAL | Status: DC
  Administered 2022-05-20 – 2022-05-21 (×2): 500 mg via ORAL
  Filled 2022-05-20 (×3): qty 1

## 2022-05-20 MED ORDER — SUGAMMADEX SODIUM 200 MG/2ML IV SOLN
INTRAVENOUS | Status: DC | PRN
  Administered 2022-05-20: 150 mg via INTRAVENOUS

## 2022-05-20 MED ORDER — ASPIRIN 325 MG PO TBEC
325.0000 mg | DELAYED_RELEASE_TABLET | Freq: Every day | ORAL | Status: DC
  Administered 2022-05-21: 325 mg via ORAL
  Filled 2022-05-20: qty 1

## 2022-05-20 MED ORDER — ONDANSETRON HCL 4 MG/2ML IJ SOLN: 4.0000 mg | Freq: Once | INTRAMUSCULAR | Status: DC | PRN

## 2022-05-20 MED ORDER — GLYCOPYRROLATE 0.2 MG/ML IJ SOLN
INTRAMUSCULAR | Status: DC | PRN
  Administered 2022-05-20: .1 mg via INTRAVENOUS

## 2022-05-20 MED ORDER — ROCURONIUM BROMIDE 100 MG/10ML IV SOLN
INTRAVENOUS | Status: DC | PRN
  Administered 2022-05-20: 50 mg via INTRAVENOUS

## 2022-05-20 MED ORDER — BUPIVACAINE LIPOSOME 1.3 % IJ SUSP
INTRAMUSCULAR | Status: DC | PRN
  Administered 2022-05-20: 10 mL via PERINEURAL

## 2022-05-20 MED ORDER — HYDROCODONE-ACETAMINOPHEN 5-325 MG PO TABS: 1.0000 | ORAL_TABLET | ORAL | 0 refills | Status: DC | PRN

## 2022-05-20 MED ORDER — ONDANSETRON HCL 4 MG/2ML IJ SOLN
INTRAMUSCULAR | Status: DC | PRN
  Administered 2022-05-20: 4 mg via INTRAVENOUS

## 2022-05-20 MED ORDER — ONDANSETRON HCL 4 MG PO TABS: 4.0000 mg | ORAL_TABLET | Freq: Three times a day (TID) | ORAL | 0 refills | Status: DC | PRN

## 2022-05-20 MED ORDER — PANTOPRAZOLE SODIUM 40 MG PO TBEC
40.0000 mg | DELAYED_RELEASE_TABLET | Freq: Every day | ORAL | Status: DC
  Administered 2022-05-21: 40 mg via ORAL
  Filled 2022-05-20: qty 1

## 2022-05-20 MED ORDER — TRANEXAMIC ACID-NACL 1000-0.7 MG/100ML-% IV SOLN
1000.0000 mg | INTRAVENOUS | Status: AC
  Administered 2022-05-20: 1000 mg via INTRAVENOUS
  Filled 2022-05-20: qty 100

## 2022-05-20 MED ORDER — CEFAZOLIN SODIUM-DEXTROSE 2-4 GM/100ML-% IV SOLN
2.0000 g | INTRAVENOUS | Status: AC
  Administered 2022-05-20: 2 g via INTRAVENOUS
  Filled 2022-05-20: qty 100

## 2022-05-20 MED ORDER — DEXMEDETOMIDINE HCL IN NACL 80 MCG/20ML IV SOLN
INTRAVENOUS | Status: DC | PRN
  Administered 2022-05-20: 4 ug via BUCCAL

## 2022-05-20 MED ORDER — ORAL CARE MOUTH RINSE: 15.0000 mL | Freq: Once | OROMUCOSAL | Status: AC

## 2022-05-20 MED ORDER — OXYCODONE HCL 5 MG/5ML PO SOLN: 5.0000 mg | Freq: Once | ORAL | Status: DC | PRN

## 2022-05-20 MED ORDER — ONDANSETRON HCL 4 MG/2ML IJ SOLN
INTRAMUSCULAR | Status: AC
  Filled 2022-05-20: qty 2

## 2022-05-20 MED ORDER — CEFAZOLIN SODIUM-DEXTROSE 1-4 GM/50ML-% IV SOLN
1.0000 g | Freq: Four times a day (QID) | INTRAVENOUS | Status: AC
  Administered 2022-05-20 – 2022-05-21 (×3): 1 g via INTRAVENOUS
  Filled 2022-05-20 (×3): qty 50

## 2022-05-20 SURGICAL SUPPLY — 69 items
AID PSTN UNV HD RSTRNT DISP (MISCELLANEOUS)
BAG COUNTER SPONGE SURGICOUNT (BAG) IMPLANT
BAG SPEC THK2 15X12 ZIP CLS (MISCELLANEOUS) ×1
BAG SPNG CNTER NS LX DISP (BAG)
BAG ZIPLOCK 12X15 (MISCELLANEOUS) ×1 IMPLANT
BASEPLATE 24 10D FULL AUGM (Plate) IMPLANT
BIT DRILL FLUTED 3.0 STRL (BIT) IMPLANT
BLADE SAG 18X100X1.27 (BLADE) ×1 IMPLANT
BSPLAT GLND 10D OBLQ 24 FULL (Plate) ×1 IMPLANT
CALIBRATOR GLENOID VIP 5-D (SYSTAGENIX WOUND MANAGEMENT) IMPLANT
CEMENT BONE DEPUY (Cement) IMPLANT
COVER BACK TABLE 60X90IN (DRAPES) ×1 IMPLANT
COVER SURGICAL LIGHT HANDLE (MISCELLANEOUS) ×1 IMPLANT
CUP SUT UNIV REVERS 39 NEU (Shoulder) IMPLANT
DRAPE ORTHO SPLIT 77X108 STRL (DRAPES) ×2
DRAPE SHEET LG 3/4 BI-LAMINATE (DRAPES) ×1 IMPLANT
DRAPE SURG 17X11 SM STRL (DRAPES) ×1 IMPLANT
DRAPE SURG ORHT 6 SPLT 77X108 (DRAPES) ×2 IMPLANT
DRAPE TOP 10253 STERILE (DRAPES) ×1 IMPLANT
DRAPE U-SHAPE 47X51 STRL (DRAPES) ×1 IMPLANT
DRSG AQUACEL AG ADV 3.5X 6 (GAUZE/BANDAGES/DRESSINGS) IMPLANT
DRSG AQUACEL AG ADV 3.5X10 (GAUZE/BANDAGES/DRESSINGS) ×1 IMPLANT
DURAPREP 26ML APPLICATOR (WOUND CARE) IMPLANT
ELECT REM PT RETURN 15FT ADLT (MISCELLANEOUS) ×1 IMPLANT
FACESHIELD WRAPAROUND (MASK) ×1 IMPLANT
FACESHIELD WRAPAROUND OR TEAM (MASK) ×1 IMPLANT
GLENOSPHERE 39+4 LAT/24 UNI RV (Joint) IMPLANT
GLOVE BIO SURGEON STRL SZ7.5 (GLOVE) ×4 IMPLANT
GLOVE BIOGEL PI IND STRL 8 (GLOVE) ×2 IMPLANT
GOWN STRL REUS W/ TWL XL LVL3 (GOWN DISPOSABLE) ×2 IMPLANT
GOWN STRL REUS W/TWL XL LVL3 (GOWN DISPOSABLE) ×2
INSERT HUMERAL M/39 +3/CNSTRND (Miscellaneous) IMPLANT
KIT BASIN OR (CUSTOM PROCEDURE TRAY) ×1 IMPLANT
KIT TURNOVER KIT A (KITS) IMPLANT
MANIFOLD NEPTUNE II (INSTRUMENTS) ×1 IMPLANT
NDL TAPERED W/ NITINOL LOOP (MISCELLANEOUS) IMPLANT
NEEDLE TAPERED W/ NITINOL LOOP (MISCELLANEOUS) IMPLANT
NS IRRIG 1000ML POUR BTL (IV SOLUTION) ×1 IMPLANT
PACK SHOULDER (CUSTOM PROCEDURE TRAY) ×1 IMPLANT
PAD CAST 4YDX4 CTTN HI CHSV (CAST SUPPLIES) ×1 IMPLANT
PADDING CAST COTTON 4X4 STRL (CAST SUPPLIES) ×1
PIN NITINOL TARGETER 2.8 (PIN) IMPLANT
POST MODULAR 25 (Post) ×1 IMPLANT
POST MODULAR MGS BASEPLATE 25 (Post) IMPLANT
REAMER GLENOID UNIV SM DISP (ORTHOPEDIC DISPOSABLE SUPPLIES) IMPLANT
RESTRAINT HEAD UNIVERSAL NS (MISCELLANEOUS) IMPLANT
SCREW PERI LOCK 5.5X16 (Screw) IMPLANT
SCREW PERI LOCK 5.5X24 (Screw) IMPLANT
SCREW PERIPHERAL 5.5X28 LOCK (Screw) IMPLANT
SLING ARM FOAM STRAP MED (SOFTGOODS) IMPLANT
SLING ARM IMMOBILIZER LRG (SOFTGOODS) IMPLANT
SMARTMIX MINI TOWER (MISCELLANEOUS)
SPONGE T-LAP 4X18 ~~LOC~~+RFID (SPONGE) IMPLANT
STEM HUMERAL UNI REVERS SZ9 (Stem) IMPLANT
STRIP CLOSURE SKIN 1/2X4 (GAUZE/BANDAGES/DRESSINGS) ×1 IMPLANT
SUCTION FRAZIER HANDLE 10FR (MISCELLANEOUS) ×1
SUCTION TUBE FRAZIER 10FR DISP (MISCELLANEOUS) ×1 IMPLANT
SUT FIBERWIRE #2 38 T-5 BLUE (SUTURE)
SUT MNCRL AB 3-0 PS2 18 (SUTURE) ×1 IMPLANT
SUT VIC AB 0 CT1 36 (SUTURE) ×1 IMPLANT
SUT VIC AB 1 CT1 36 (SUTURE) ×1 IMPLANT
SUT VIC AB 2-0 CT1 27 (SUTURE) ×1
SUT VIC AB 2-0 CT1 TAPERPNT 27 (SUTURE) ×1 IMPLANT
SUTURE FIBERWR #2 38 T-5 BLUE (SUTURE) IMPLANT
SUTURE TAPE 1.3 40 TPR END (SUTURE) ×2 IMPLANT
SUTURETAPE 1.3 40 TPR END (SUTURE) ×2
TOWEL OR 17X26 10 PK STRL BLUE (TOWEL DISPOSABLE) ×1 IMPLANT
TOWER SMARTMIX MINI (MISCELLANEOUS) IMPLANT
TUBE SUCTION HIGH CAP CLEAR NV (SUCTIONS) ×1 IMPLANT

## 2022-05-20 NOTE — Anesthesia Procedure Notes (Addendum)
Anesthesia Regional Block: Interscalene brachial plexus block   Pre-Anesthetic Checklist: , timeout performed,  Correct Patient, Correct Site, Correct Laterality,  Correct Procedure, Correct Position, site marked,  Risks and benefits discussed,  Surgical consent,  Pre-op evaluation,  At surgeon's request and post-op pain management  Laterality: Right  Prep: chloraprep       Needles:  Injection technique: Single-shot  Needle Type: Echogenic Stimulator Needle     Needle Length: 10cm  Needle Gauge: 21   Needle insertion depth: 7 cm   Additional Needles:   Procedures:,,,, ultrasound used (permanent image in chart),,   Motor weakness within 7 minutes.  Narrative:  Start time: 05/20/2022 9:20 AM End time: 05/20/2022 9:25 AM Injection made incrementally with aspirations every 5 mL.  Performed by: Personally  Anesthesiologist: Josephine Igo, MD  Additional Notes: Timeout performed. Patient sedated. Relevant anatomy ID'd using [place]. Incremental 2-79ml injection of LA with frequent aspiration. Patient tolerated procedure well.

## 2022-05-20 NOTE — Transfer of Care (Signed)
Immediate Anesthesia Transfer of Care Note  Patient: Paul Bauer  Procedure(s) Performed: REVERSE SHOULDER ARTHROPLASTY (Right: Shoulder)  Patient Location: PACU  Anesthesia Type:GA combined with regional for post-op pain  Level of Consciousness: drowsy  Airway & Oxygen Therapy: Patient Spontanous Breathing and Patient connected to face mask oxygen  Post-op Assessment: Report given to RN and Post -op Vital signs reviewed and stable  Post vital signs: Reviewed and stable  Last Vitals:  Vitals Value Taken Time  BP 133/67 05/20/22 1145  Temp    Pulse 59 05/20/22 1147  Resp 19 05/20/22 1147  SpO2 100 % 05/20/22 1147  Vitals shown include unvalidated device data.  Last Pain:  Vitals:   05/20/22 0940  TempSrc:   PainSc: 0-No pain         Complications: No notable events documented.

## 2022-05-20 NOTE — Anesthesia Preprocedure Evaluation (Signed)
Anesthesia Evaluation  Patient identified by MRN, date of birth, ID band Patient awake    Reviewed: Allergy & Precautions, NPO status , Patient's Chart, lab work & pertinent test results  Airway Mallampati: II  TM Distance: >3 FB Neck ROM: Full    Dental  (+) Teeth Intact, Caps, Dental Advisory Given   Pulmonary neg pulmonary ROS   Pulmonary exam normal breath sounds clear to auscultation       Cardiovascular Normal cardiovascular exam Rhythm:Regular Rate:Normal     Neuro/Psych negative neurological ROS  negative psych ROS   GI/Hepatic hiatal hernia,GERD  Medicated,,  Endo/Other  Hyperlipidemia  Renal/GU Renal InsufficiencyRenal diseaseHx/o renal calculi  negative genitourinary   Musculoskeletal OA right shoulder   Abdominal   Peds  Hematology Thrombocytopenia- Plt 142k   Anesthesia Other Findings   Reproductive/Obstetrics                              Anesthesia Physical Anesthesia Plan  ASA: 2  Anesthesia Plan: General   Post-op Pain Management: Regional block* and Minimal or no pain anticipated   Induction: Intravenous  PONV Risk Score and Plan: 3 and Treatment may vary due to age or medical condition, Ondansetron and Dexamethasone  Airway Management Planned: Oral ETT  Additional Equipment: None  Intra-op Plan:   Post-operative Plan: Extubation in OR  Informed Consent: I have reviewed the patients History and Physical, chart, labs and discussed the procedure including the risks, benefits and alternatives for the proposed anesthesia with the patient or authorized representative who has indicated his/her understanding and acceptance.     Dental advisory given  Plan Discussed with: CRNA and Anesthesiologist  Anesthesia Plan Comments:          Anesthesia Quick Evaluation

## 2022-05-20 NOTE — Anesthesia Postprocedure Evaluation (Signed)
Anesthesia Post Note  Patient: AZEKIEL PADLEY  Procedure(s) Performed: REVERSE SHOULDER ARTHROPLASTY (Right: Shoulder)     Patient location during evaluation: PACU Anesthesia Type: General Level of consciousness: awake and alert and oriented Pain management: pain level controlled Vital Signs Assessment: post-procedure vital signs reviewed and stable Respiratory status: spontaneous breathing, nonlabored ventilation and respiratory function stable Cardiovascular status: stable and blood pressure returned to baseline Postop Assessment: no apparent nausea or vomiting Anesthetic complications: no   No notable events documented.  Last Vitals:  Vitals:   05/20/22 1215 05/20/22 1230  BP: 119/63 115/69  Pulse: 61 63  Resp: (!) 23 15  Temp:    SpO2: 93% 95%    Last Pain:  Vitals:   05/20/22 1230  TempSrc:   PainSc: 0-No pain                 Dymon Summerhill A.

## 2022-05-20 NOTE — Discharge Instructions (Signed)
Orthopedic surgery discharge instructions:  -Maintain postoperative bandage until follow-up appointment.  This is waterproof, and you may begin showering on postoperative day #3.  Do not submerge underwater.  Maintain that bandage until your follow-up appointment in 2 weeks.  -No lifting over 2 pounds with operateive arm.  You may use the arm immediately for activities of daily living such as bathing, washing your face and brushing your teeth, eating, and getting dressed.  Otherwise maintain your sling when you are out of the house and sleeping.  -Apply ice liberally to the shoulder throughout the day.  For mild to moderate pain use Tylenol and Advil as needed around-the-clock.  For breakthrough pain use oxycodone as necessary.  -You will return to see Dr. Lygia Olaes in the office in 2 weeks for routine postoperative check with x-rays.  

## 2022-05-20 NOTE — Brief Op Note (Signed)
05/20/2022  11:28 AM  PATIENT:  Paul Bauer  82 y.o. male  PRE-OPERATIVE DIAGNOSIS:  Right shoulder osteoarthritis  POST-OPERATIVE DIAGNOSIS:  Right shoulder osteoarthritis  PROCEDURE:  Procedure(s) with comments: REVERSE SHOULDER ARTHROPLASTY (Right) - 120  SURGEON:  Surgeon(s) and Role:    Stann Mainland, Elly Modena, MD - Primary  ASSISTANTS: Jonelle Sidle, PA-C   ANESTHESIA:   regional and general  EBL: 150 cc  BLOOD ADMINISTERED:none  DRAINS: none   LOCAL MEDICATIONS USED:  NONE  SPECIMEN:  No Specimen  DISPOSITION OF SPECIMEN:  N/A  COUNTS:  YES  TOURNIQUET:  * No tourniquets in log *  DICTATION: .Note written in EPIC  PLAN OF CARE: Admit for overnight observation  PATIENT DISPOSITION:  PACU - hemodynamically stable.   Delay start of Pharmacological VTE agent (>24hrs) due to surgical blood loss or risk of bleeding: not applicable

## 2022-05-20 NOTE — Op Note (Signed)
05/20/2022  11:36 AM  PATIENT:  Paul Bauer    PRE-OPERATIVE DIAGNOSIS:  Right shoulder osteoarthritis  POST-OPERATIVE DIAGNOSIS:  Same  PROCEDURE:  Right REVERSE SHOULDER ARTHROPLASTY  SURGEON:  Nicholes Stairs, MD  ASSISTANT: Jonelle Sidle, PA-C  Assistant Attestation:  PA McClung present for the entire procedure.  ANESTHESIA:   General  ESTIMATED BLOOD LOSS: 150 cc  PREOPERATIVE INDICATIONS:  Paul Bauer is a  82 y.o. male with a diagnosis of Right shoulder osteoarthritis who failed conservative measures and elected for surgical management.    The risks benefits and alternatives were discussed with the patient preoperatively including but not limited to the risks of infection, bleeding, nerve injury, cardiopulmonary complications, the need for revision surgery, dislocation, brachial plexus palsy, incomplete relief of pain, among others, and the patient was willing to proceed.  OPERATIVE IMPLANTS:  Arthrex universe reverse arthroplasty system with a full wedge augment on the glenoid oriented at the 10:30 position.  This is a 10 degree wedge.  25 mm central post and 4 peripheral locking screws.  39 mm +4 lateralized glenosphere  On the humeral side size 9 stem standard length.  Standard humeral tray with +3 constrained polyethylene liner.   OPERATIVE FINDINGS:  Operative findings included intact rotator cuff musculature x 4, with some thinning of the supraspinatus and partial articular tearing of about 50%.  However, significant and advanced glenohumeral osteoarthritis with central and posterior wear.  No true biconcavity so this was a B1 glenoid.  Full-thickness cartilage loss on the humeral head and glenoid.  Long head of biceps tendon was intact but significantly flattened and stenotic in the groove.  Marginal osteophytes which were large along the inferior humeral head.  OPERATIVE PROCEDURE: The patient was brought to the operating room and placed in the supine  position. General anesthesia was administered. IV antibiotics were given. A Foley was not placed. Time out was performed. The upper extremity was prepped and draped in usual sterile fashion. The patient was in a beachchair position. Deltopectoral approach was carried out. The biceps was released. The subscapularis was released off of the bone.   I then performed circumferential releases of the humerus, and then dislocated the head, and then reamed with the reamer to the above named size.  I then applied the jig, and cut the humeral head in 30 of retroversion, and then turned my attention to the glenoid.  Deep retractors were placed, and I resected the labrum, and then placed a guidepin into the center position on the glenoid, with slight inferior inclination.  We did utilize the appropriate augment reamer, full wedge reamer for fit of the 10 degree full wedge oriented at the 10:30 position.  I then reamed over the guidepin, and this created a small metaphyseal cancellus blush inferiorly, removing just the cartilage to the subchondral bone superiorly. The base plate was selected and impacted place, with the 25 mm post which was bicortical.  We placed 4 peripheral locking screws.  I then turned my attention to the glenosphere, and impacted into place to engage the [facility] taper and then placed our central setscrew.  The glenoid sphere was completely seated, and had engagement of the [facility] taper. I then turned my attention back to the humerus.  I sequentially broached, and then trialed, and was found to restore soft tissue tension, and it had 2 finger tightness. Therefore the above named components were selected. The shoulder felt stable throughout functional motion.   I then impacted the real  prosthesis into place, as well as the real humeral tray, and reduced the shoulder. The shoulder had excellent motion, and was stable, and I irrigated the wounds copiously.   We did repair the subscapularis back to  the anterior humeral cortex via the suture cup suture holes.  We utilized a suture tape #2.  I then irrigated the shoulder copiously once more, placed 1 g of vancomycin powder into the wound, repaired the deltopectoral interval with # 1 Vicryl, followed by subcutaneous Vicryl, then monocryl for the skin,  with Steri-Strips and sterile gauze for the skin. The patient was awakened and returned back in stable and satisfactory condition. There no complications and they tolerated the procedure well.  All counts were correct x2.  Disposition:  We then transported the patient to PACU in stable condition.  He will be able to use the right arm immediately for activities of daily living.  However, no lifting over 2 pounds.  He will sleep in his sling for 2 weeks.  Will be admitted to the orthopedic floor for postoperative occupational therapy and pain control.  Discharge home tomorrow.  I will see him back in the office in 2 weeks for routine postoperative check and 2 x-ray views, AP and scapular Y of the right shoulder.

## 2022-05-20 NOTE — Plan of Care (Signed)
  Problem: Education: Goal: Knowledge of the prescribed therapeutic regimen will improve Outcome: Progressing Goal: Understanding of activity limitations/precautions following surgery will improve Outcome: Progressing Goal: Individualized Educational Video(s) Outcome: Progressing   Problem: Activity: Goal: Ability to tolerate increased activity will improve Outcome: Progressing   Problem: Pain Management: Goal: Pain level will decrease with appropriate interventions Outcome: Progressing   Problem: Education: Goal: Knowledge of the prescribed therapeutic regimen will improve Outcome: Progressing Goal: Understanding of activity limitations/precautions following surgery will improve Outcome: Progressing Goal: Individualized Educational Video(s) Outcome: Progressing   Problem: Activity: Goal: Ability to tolerate increased activity will improve Outcome: Progressing   Problem: Pain Management: Goal: Pain level will decrease with appropriate interventions Outcome: Progressing   Problem: Education: Goal: Knowledge of General Education information will improve Description: Including pain rating scale, medication(s)/side effects and non-pharmacologic comfort measures Outcome: Progressing   Problem: Health Behavior/Discharge Planning: Goal: Ability to manage health-related needs will improve Outcome: Progressing   Problem: Clinical Measurements: Goal: Ability to maintain clinical measurements within normal limits will improve Outcome: Progressing Goal: Will remain free from infection Outcome: Progressing Goal: Diagnostic test results will improve Outcome: Progressing Goal: Respiratory complications will improve Outcome: Progressing Goal: Cardiovascular complication will be avoided Outcome: Progressing   Problem: Activity: Goal: Risk for activity intolerance will decrease Outcome: Progressing   Problem: Nutrition: Goal: Adequate nutrition will be maintained Outcome:  Progressing   Problem: Coping: Goal: Level of anxiety will decrease Outcome: Progressing   Problem: Elimination: Goal: Will not experience complications related to bowel motility Outcome: Progressing Goal: Will not experience complications related to urinary retention Outcome: Progressing   Problem: Pain Managment: Goal: General experience of comfort will improve Outcome: Progressing   Problem: Safety: Goal: Ability to remain free from injury will improve Outcome: Progressing   Problem: Skin Integrity: Goal: Risk for impaired skin integrity will decrease Outcome: Progressing   Problem: Education: Goal: Knowledge of General Education information will improve Description: Including pain rating scale, medication(s)/side effects and non-pharmacologic comfort measures Outcome: Progressing   Problem: Clinical Measurements: Goal: Ability to maintain clinical measurements within normal limits will improve Outcome: Progressing Goal: Will remain free from infection Outcome: Progressing Goal: Diagnostic test results will improve Outcome: Progressing Goal: Respiratory complications will improve Outcome: Progressing Goal: Cardiovascular complication will be avoided Outcome: Progressing   Problem: Activity: Goal: Risk for activity intolerance will decrease Outcome: Progressing   Problem: Nutrition: Goal: Adequate nutrition will be maintained Outcome: Progressing   Problem: Coping: Goal: Level of anxiety will decrease Outcome: Progressing   Problem: Elimination: Goal: Will not experience complications related to bowel motility Outcome: Progressing Goal: Will not experience complications related to urinary retention Outcome: Progressing   Problem: Pain Managment: Goal: General experience of comfort will improve Outcome: Progressing   Problem: Safety: Goal: Ability to remain free from injury will improve Outcome: Progressing   Problem: Skin Integrity: Goal: Risk for  impaired skin integrity will decrease Outcome: Progressing

## 2022-05-20 NOTE — H&P (Signed)
ORTHOPAEDIC H and P  REQUESTING PHYSICIAN: Nicholes Stairs, MD  PCP:  Gaynelle Arabian, MD  Chief Complaint: Right shoulder rotator cuff arthropathy  HPI: Paul Bauer is a 82 y.o. male who complains of right shoulder weakness and pain.  He has had diminishing function over the last few years.  We have been managing him with injections and oral medications.  He is here today for definitive treatment with reverse arthroplasty.  No new complaints this time.  Past Medical History:  Diagnosis Date   GERD (gastroesophageal reflux disease)    Hiatal hernia    History of kidney stones    Hyperlipemia    Kidney calculi    Past Surgical History:  Procedure Laterality Date   ORIF TIBIA & FIBULA FRACTURES     Social History   Socioeconomic History   Marital status: Widowed    Spouse name: Not on file   Number of children: Not on file   Years of education: Not on file   Highest education level: Not on file  Occupational History   Not on file  Tobacco Use   Smoking status: Never   Smokeless tobacco: Never  Vaping Use   Vaping Use: Never used  Substance and Sexual Activity   Alcohol use: No   Drug use: No   Sexual activity: Not on file  Other Topics Concern   Not on file  Social History Narrative   Not on file   Social Determinants of Health   Financial Resource Strain: Not on file  Food Insecurity: Not on file  Transportation Needs: Not on file  Physical Activity: Not on file  Stress: Not on file  Social Connections: Not on file   History reviewed. No pertinent family history. No Known Allergies Prior to Admission medications   Medication Sig Start Date End Date Taking? Authorizing Provider  aspirin EC 325 MG tablet Take 325 mg by mouth daily.   Yes [provider]  Bioflavonoid Products (BIOFLEX) TABS Take 1 tablet by mouth daily.   Yes [provider]  Multiple Vitamin (MULTIVITAMIN WITH MINERALS) TABS tablet Take 1 tablet by mouth  daily.   Yes [provider]  omeprazole (PRILOSEC) 40 MG capsule Take 40 mg by mouth daily.     Yes [provider]  Red Yeast Rice 600 MG CAPS Take 600 mg by mouth daily.   Yes [provider]  simvastatin (ZOCOR) 80 MG tablet Take 80 mg by mouth at bedtime.     Yes [provider]  Aspirin-Salicylamide-Caffeine (BC HEADACHE POWDER PO) Take 1 packet by mouth daily.    [provider]   No results found.  Positive ROS: All other systems have been reviewed and were otherwise negative with the exception of those mentioned in the HPI and as above.  Physical Exam: General: Alert, no acute distress Cardiovascular: No pedal edema Respiratory: No cyanosis, no use of accessory musculature GI: No organomegaly, abdomen is soft and non-tender Skin: No lesions in the area of chief complaint Neurologic: Sensation intact distally Psychiatric: Patient is competent for consent with normal mood and affect Lymphatic: No axillary or cervical lymphadenopathy  MUSCULOSKELETAL: Right upper extremity warm and well-perfused no open wounds or lesions.  Neurovascular intact.  Assessment:  Right shoulder end-stage rotator cuff arthropathy   Plan: Plan proceed today with reverse arthroplasty for definitive treatment of right shoulder.  We again reviewed and discussed the risk benefits of the procedure in detail including but elevated to  bleeding, infection, damage to surrounding nerves and vessels, stiffness, fracture, dislocation, need for revision surgery, as well as risk of anesthesia.  Provided informed consent.    Nicholes Stairs, MD Cell [phone]    05/20/2022 9:13 AM

## 2022-05-20 NOTE — Anesthesia Procedure Notes (Signed)
Procedure Name: Intubation Date/Time: 05/20/2022 10:02 AM  Performed by: Gwyndolyn Saxon, CRNAPre-anesthesia Checklist: Patient identified, Emergency Drugs available, Suction available and Patient being monitored Patient Re-evaluated:Patient Re-evaluated prior to induction Oxygen Delivery Method: Circle system utilized Preoxygenation: Pre-oxygenation with 100% oxygen Induction Type: IV induction Ventilation: Mask ventilation without difficulty Laryngoscope Size: Miller and 2 Grade View: Grade I Tube type: Oral Tube size: 8.0 mm Number of attempts: 1 Airway Equipment and Method: Stylet Placement Confirmation: ETT inserted through vocal cords under direct vision, positive ETCO2 and breath sounds checked- equal and bilateral Secured at: 23 cm Tube secured with: Tape Dental Injury: Teeth and Oropharynx as per pre-operative assessment

## 2022-05-21 DIAGNOSIS — M19011 Primary osteoarthritis, right shoulder: Secondary | ICD-10-CM | POA: Diagnosis not present

## 2022-05-21 LAB — HEMOGLOBIN AND HEMATOCRIT, BLOOD
HCT: 33.6 % — ABNORMAL LOW (ref 39.0–52.0)
Hemoglobin: 11.4 g/dL — ABNORMAL LOW (ref 13.0–17.0)

## 2022-05-21 LAB — BASIC METABOLIC PANEL
Anion gap: 6 (ref 5–15)
BUN: 27 mg/dL — ABNORMAL HIGH (ref 8–23)
CO2: 22 mmol/L (ref 22–32)
Calcium: 8.5 mg/dL — ABNORMAL LOW (ref 8.9–10.3)
Chloride: 107 mmol/L (ref 98–111)
Creatinine, Ser: 1.35 mg/dL — ABNORMAL HIGH (ref 0.61–1.24)
GFR, Estimated: 53 mL/min — ABNORMAL LOW (ref >60)
Glucose, Bld: 130 mg/dL — ABNORMAL HIGH (ref 70–99)
Potassium: 4.1 mmol/L (ref 3.5–5.1)
Sodium: 135 mmol/L (ref 135–145)

## 2022-05-21 NOTE — Progress Notes (Signed)
Subjective: 1 Day Post-Op Procedure(s) (LRB): REVERSE SHOULDER ARTHROPLASTY (Right) Patient reports pain as 8 on 0-10 scale.   Patient seen around this morning for Dr. Stann Mainland He is doing well, reports that he is doing pretty good, minimal numbness and tingling in the posterior aspect of his shoulder He has not seen occupational therapy at this morning  Objective: Vital signs in last 24 hours: Temp:  [97.4 F (36.3 C)-98.8 F (37.1 C)] 98.8 F (37.1 C) (03/23 0540) Pulse Rate:  [59-83] 66 (03/23 0540) Resp:  [13-23] 17 (03/23 0540) BP: (111-164)/(63-94) 126/76 (03/23 0540) SpO2:  [92 %-100 %] 93 % (03/23 0540)  Intake/Output from previous day: 03/22 0701 - 03/23 0700 In: 1498 [P.O.:598; I.V.:500; IV Piggyback:400] Out: 1770 [Urine:1620; Blood:150] Intake/Output this shift: Total I/O In: 48.6 [IV Piggyback:48.6] Out: 200 [Urine:200]  Recent Labs    05/21/22 0326  HGB 11.4*   Recent Labs    05/21/22 0326  HCT 33.6*   Recent Labs    05/21/22 0326  NA 135  K 4.1  CL 107  CO2 22  BUN 27*  CREATININE 1.35*  GLUCOSE 130*  CALCIUM 8.5*   No results for input(s): "LABPT", "INR" in the last 72 hours.  Neurologically intact Neurovascular intact Sensation intact distally Intact pulses distally Compartment soft Able to make a fist Dressings clean dry and intact  Assessment/Plan: 1 Day Post-Op Procedure(s) (LRB): REVERSE SHOULDER ARTHROPLASTY (Right) We will get him up with occupational therapy this morning No lifting over 2 pounds Continue in sling 24/7 until 2-week follow-up, he can take it off for showers and for exercises Okay to get dressings wet If he passes therapy this morning he is okay to be discharged home today   Derrick Ravel [phone] 05/21/2022, 9:08 AM
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
## 2022-05-21 NOTE — Progress Notes (Signed)
  Transition of Care [facility]) Screening Note   Patient Details  Name: Paul Bauer Date of Birth: 03-May-1940   Transition of Care [facility]) CM/SW Contact:    Roseanne Kaufman, RN Phone Number: 05/21/2022, 9:23 AM    Transition of Care Department [facility]) has reviewed patient and no TOC needs have been identified at this time. We will continue to monitor patient advancement through interdisciplinary progression rounds. If new patient transition needs arise, please place a TOC consult.

## 2022-05-21 NOTE — Plan of Care (Signed)
  Problem: Education: Goal: Knowledge of the prescribed therapeutic regimen will improve Outcome: Completed/Met Goal: Understanding of activity limitations/precautions following surgery will improve Outcome: Completed/Met Goal: Individualized Educational Video(s) Outcome: Completed/Met   Problem: Activity: Goal: Ability to tolerate increased activity will improve Outcome: Completed/Met   Problem: Pain Management: Goal: Pain level will decrease with appropriate interventions Outcome: Completed/Met   Problem: Education: Goal: Knowledge of General Education information will improve Description: Including pain rating scale, medication(s)/side effects and non-pharmacologic comfort measures Outcome: Completed/Met   Problem: Health Behavior/Discharge Planning: Goal: Ability to manage health-related needs will improve Outcome: Completed/Met   Problem: Clinical Measurements: Goal: Ability to maintain clinical measurements within normal limits will improve Outcome: Completed/Met Goal: Will remain free from infection Outcome: Completed/Met Goal: Diagnostic test results will improve Outcome: Completed/Met Goal: Respiratory complications will improve Outcome: Completed/Met Goal: Cardiovascular complication will be avoided Outcome: Completed/Met   Problem: Activity: Goal: Risk for activity intolerance will decrease Outcome: Completed/Met   Problem: Nutrition: Goal: Adequate nutrition will be maintained Outcome: Completed/Met   Problem: Coping: Goal: Level of anxiety will decrease Outcome: Completed/Met   Problem: Elimination: Goal: Will not experience complications related to bowel motility Outcome: Completed/Met Goal: Will not experience complications related to urinary retention Outcome: Completed/Met   Problem: Pain Managment: Goal: General experience of comfort will improve Outcome: Completed/Met   Problem: Safety: Goal: Ability to remain free from injury will  improve Outcome: Completed/Met   Problem: Skin Integrity: Goal: Risk for impaired skin integrity will decrease Outcome: Completed/Met   Problem: Education: Goal: Knowledge of the prescribed therapeutic regimen will improve Outcome: Completed/Met Goal: Understanding of activity limitations/precautions following surgery will improve Outcome: Completed/Met Goal: Individualized Educational Video(s) Outcome: Completed/Met   Problem: Activity: Goal: Ability to tolerate increased activity will improve Outcome: Completed/Met   Problem: Pain Management: Goal: Pain level will decrease with appropriate interventions Outcome: Completed/Met   Problem: Education: Goal: Knowledge of General Education information will improve Description: Including pain rating scale, medication(s)/side effects and non-pharmacologic comfort measures Outcome: Completed/Met   Problem: Health Behavior/Discharge Planning: Goal: Ability to manage health-related needs will improve Outcome: Completed/Met   Problem: Clinical Measurements: Goal: Ability to maintain clinical measurements within normal limits will improve Outcome: Completed/Met Goal: Will remain free from infection Outcome: Completed/Met Goal: Diagnostic test results will improve Outcome: Completed/Met Goal: Respiratory complications will improve Outcome: Completed/Met Goal: Cardiovascular complication will be avoided Outcome: Completed/Met   Problem: Activity: Goal: Risk for activity intolerance will decrease Outcome: Completed/Met   Problem: Nutrition: Goal: Adequate nutrition will be maintained Outcome: Completed/Met   Problem: Coping: Goal: Level of anxiety will decrease Outcome: Completed/Met   Problem: Elimination: Goal: Will not experience complications related to bowel motility Outcome: Completed/Met Goal: Will not experience complications related to urinary retention Outcome: Completed/Met   Problem: Pain Managment: Goal:  General experience of comfort will improve Outcome: Completed/Met   Problem: Safety: Goal: Ability to remain free from injury will improve Outcome: Completed/Met   Problem: Skin Integrity: Goal: Risk for impaired skin integrity will decrease Outcome: Completed/Met

## 2022-05-24 ENCOUNTER — Encounter (HOSPITAL_COMMUNITY): Payer: Self-pay | Admitting: Orthopedic Surgery

## 2022-05-27 NOTE — Discharge Summary (Signed)
Patient ID: Paul Bauer MRN: [id number] DOB/AGE: Sep 27, 1940 82 y.o.  Admit date: 05/20/2022 Discharge date: 05/21/2022  Primary Diagnosis: Right shoulder osteoarthritis Admission Diagnoses: Status post right reverse total shoulder Past Medical History:  Diagnosis Date   GERD (gastroesophageal reflux disease)    Hiatal hernia    History of kidney stones    Hyperlipemia    Kidney calculi    Discharge Diagnoses:   Principal Problem:   History of reverse total replacement of right shoulder joint  Estimated body mass index is 21.52 kg/m as calculated from the following:   Height as of this encounter: 5\' 11"  (1.803 m).   Weight as of this encounter: 70 kg.  Procedure:  Procedure(s) (LRB): REVERSE SHOULDER ARTHROPLASTY (Right)   Consults: None  HPI: Paul Bauer is an 82 year old male who was seen in our office for chronic right shoulder pain.  He was noted to have end-stage osteoarthritis.  He failed conservative treatment and was indicated for right reverse total shoulder arthroplasty.  He underwent reverse total shoulder arthroplasty on 05/20/2022 and was admitted for postoperative monitoring.  Laboratory Data: Admission on 05/20/2022, Discharged on 05/21/2022  Component Date Value Ref Range Status   Hemoglobin 05/21/2022 11.4 (L)  13.0 - 17.0 g/dL Final   HCT 05/21/2022 33.6 (L)  39.0 - 52.0 % Final   Performed at [facility], [place] [address], [place], [place] [address]   Sodium 05/21/2022 135  135 - 145 mmol/L Final   Potassium 05/21/2022 4.1  3.5 - 5.1 mmol/L Final   Chloride 05/21/2022 107  98 - 111 mmol/L Final   CO2 05/21/2022 22  22 - 32 mmol/L Final   Glucose, Bld 05/21/2022 130 (H)  70 - 99 mg/dL Final   Glucose reference range applies only to samples taken after fasting for at least 8 hours.   BUN 05/21/2022 27 (H)  8 - 23 mg/dL Final   Creatinine, Ser 05/21/2022 1.35 (H)  0.61 - 1.24 mg/dL Final   Calcium 05/21/2022 8.5 (L)  8.9 - 10.3 mg/dL  Final   GFR, Estimated 05/21/2022 53 (L)  >60 mL/min Final   Comment: (NOTE) Calculated using the CKD-EPI Creatinine Equation (2021)    Anion gap 05/21/2022 6  5 - 15 Final   Performed at [facility] [place] [address] [place], [place] [address]  Hospital Outpatient Visit on 05/11/2022  Component Date Value Ref Range Status   MRSA, PCR 05/11/2022 NEGATIVE  NEGATIVE Final   Staphylococcus aureus 05/11/2022 POSITIVE (A)  NEGATIVE Final   Comment: (NOTE) The Xpert SA Assay (FDA approved for NASAL specimens in patients 3 years of age and older), is one component of a comprehensive surveillance program. It is not intended to diagnose infection nor to guide or monitor treatment. Performed at [facility], [place] [address], [place], [place] [address]    WBC 05/11/2022 4.7  4.0 - 10.5 K/uL Final   RBC 05/11/2022 4.30  4.22 - 5.81 MIL/uL Final   Hemoglobin 05/11/2022 14.3  13.0 - 17.0 g/dL Final   HCT 05/11/2022 42.4  39.0 - 52.0 % Final   MCV 05/11/2022 98.6  80.0 - 100.0 fL Final   MCH 05/11/2022 33.3  26.0 - 34.0 pg Final   MCHC 05/11/2022 33.7  30.0 - 36.0 g/dL Final   RDW 05/11/2022 13.1  11.5 - 15.5 % Final   Platelets 05/11/2022 142 (L)  150 - 400 K/uL Final   nRBC 05/11/2022 0.0  0.0 -  0.2 % Final   Performed at [facility], [place] [address] [place], [place] [address]   Sodium 05/11/2022 142  135 - 145 mmol/L Final   Potassium 05/11/2022 4.0  3.5 - 5.1 mmol/L Final   Chloride 05/11/2022 109  98 - 111 mmol/L Final   CO2 05/11/2022 25  22 - 32 mmol/L Final   Glucose, Bld 05/11/2022 71  70 - 99 mg/dL Final   Glucose reference range applies only to samples taken after fasting for at least 8 hours.   BUN 05/11/2022 22  8 - 23 mg/dL Final   Creatinine, Ser 05/11/2022 1.48 (H)  0.61 - 1.24 mg/dL Final   Calcium 05/11/2022 8.8 (L)  8.9 - 10.3 mg/dL Final   GFR, Estimated 05/11/2022 48 (L)  >60 mL/min Final   Comment:  (NOTE) Calculated using the CKD-EPI Creatinine Equation (2021)    Anion gap 05/11/2022 8  5 - 15 Final   Performed at [facility], [place] [address] [place],  [address]     X-Rays:DG Shoulder Right Port  Result Date: 05/20/2022 CLINICAL DATA:  History are subtle right shoulder arthroplasty. EXAM: RIGHT SHOULDER - 1 VIEW COMPARISON:  CT right shoulder 04/17/2022 FINDINGS: Interval reverse total right shoulder arthroplasty. No perihardware lucency is seen to indicate hardware failure or loosening on single frontal view. Expected postoperative subcutaneous air. Normal alignment of the acromioclavicular joint. No acute fracture or dislocation. Moderate multilevel degenerative disc changes of the thoracic spine with mild-to-moderate dextrocurvature of the mid to lower thoracic spine. IMPRESSION: Interval reverse total right shoulder arthroplasty without complicating features. Electronically Signed   By: Yvonne Kendall M.D.   On: 05/20/2022 13:37    EKG: Orders placed or performed during the hospital encounter of 07/02/19   EKG 12-Lead   EKG 12-Lead   EKG     Hospital Course: Paul Bauer is a 82 y.o. who was admitted to Hospital. They were brought to the operating room on 05/20/2022 and underwent Procedure(s): [place].  Patient tolerated the procedure well and was later transferred to the recovery room and then to the orthopaedic floor for postoperative care.  They were given PO and IV analgesics for pain control following their surgery.  They were given 24 hours of postoperative antibiotics of  Anti-infectives (From admission, onward)    Start     Dose/Rate Route Frequency Ordered Stop   05/20/22 1600  ceFAZolin (ANCEF) IVPB 1 g/50 mL premix        1 g 100 mL/hr over 30 Minutes Intravenous Every 6 hours 05/20/22 1343 05/21/22 0509   05/20/22 1030  vancomycin (VANCOCIN) powder  Status:  Discontinued          As needed 05/20/22 1030 05/20/22 1334    05/20/22 0800  ceFAZolin (ANCEF) IVPB 2g/100 mL premix        2 g 200 mL/hr over 30 Minutes Intravenous On call to O.R. 05/20/22 [id number] 05/20/22 1032      and started on DVT prophylaxis in the form of Aspirin.   OT was ordered for total joint protocol.  Discharge planning consulted to help with postop disposition and equipment needs.  Patient had an uneventful night on the evening of surgery.  They started to get up OOB with therapy on day one. ., the patient had progressed with therapy and meeting their goals.  Patient was seen in rounds and was ready to go home.   Diet: Regular diet Activity:NWB Follow-up:in 2 weeks  Disposition - Home Discharged Condition: good   Discharge Instructions     Call MD / Call 911   Complete by: As directed    If you experience chest pain or shortness of breath, CALL 911 and be transported to the hospital emergency room.  If you develope a fever above 101 F, pus (white drainage) or increased drainage or redness at the wound, or calf pain, call your surgeon's office.   Constipation Prevention   Complete by: As directed    Drink plenty of fluids.  Prune juice may be helpful.  You may use a stool softener, such as Colace (over the counter) 100 mg twice a day.  Use MiraLax (over the counter) for constipation as needed.   Diet - low sodium heart healthy   Complete by: As directed    Discharge instructions   Complete by: As directed    No lifting greater then 2 pounds with surgical arm Remain in sling until 2-week follow-up appointment Call on Monday for 2-week postop appointment Leave dressings on until appointment   Increase activity slowly as tolerated   Complete by: As directed    Post-operative opioid taper instructions:   Complete by: As directed    POST-OPERATIVE OPIOID TAPER INSTRUCTIONS: It is important to wean off of your opioid medication as soon as possible. If you do not need pain medication after your surgery it is ok to stop day one. Opioids  include: Codeine, Hydrocodone(Norco, Vicodin), Oxycodone(Percocet, oxycontin) and hydromorphone amongst others.  Long term and even short term use of opiods can cause: Increased pain response Dependence Constipation Depression Respiratory depression And more.  Withdrawal symptoms can include Flu like symptoms Nausea, vomiting And more Techniques to manage these symptoms Hydrate well Eat regular healthy meals Stay active Use relaxation techniques(deep breathing, meditating, yoga) Do Not substitute Alcohol to help with tapering If you have been on opioids for less than two weeks and do not have pain than it is ok to stop all together.  Plan to wean off of opioids This plan should start within one week post op of your joint replacement. Maintain the same interval or time between taking each dose and first decrease the dose.  Cut the total daily intake of opioids by one tablet each day Next start to increase the time between doses. The last dose that should be eliminated is the evening dose.         Allergies as of 05/21/2022   No Known Allergies      Medication List     TAKE these medications    aspirin EC 325 MG tablet Take 325 mg by mouth daily.   BC HEADACHE POWDER PO Take 1 packet by mouth daily.   Bioflex Tabs Take 1 tablet by mouth daily.   HYDROcodone-acetaminophen 5-325 MG tablet Commonly known as: NORCO/VICODIN Take 1 tablet by mouth every 4 (four) hours as needed for moderate pain or severe pain.   multivitamin with minerals Tabs tablet Take 1 tablet by mouth daily.   omeprazole 40 MG capsule Commonly known as: PRILOSEC Take 40 mg by mouth daily.   ondansetron 4 MG tablet Commonly known as: Zofran Take 1 tablet (4 mg total) by mouth every 8 (eight) hours as needed for nausea or vomiting.   Red Yeast Rice 600 MG Caps Take 600 mg by mouth daily.   simvastatin 80 MG tablet Commonly known as: ZOCOR Take 80 mg by mouth at bedtime.         Follow-up  Information     Nicholes Stairs, MD Follow up in 2 week(s).   Specialty: Orthopedic Surgery Contact information: [address] [place] [address] [phone]                 Signed: Jonelle Sidle PA-C Orthopaedic Surgery 05/27/2022, 3:04 PM

## 2022-06-07 ENCOUNTER — Emergency Department (HOSPITAL_COMMUNITY): Payer: Medicare (Managed Care)

## 2022-06-07 ENCOUNTER — Other Ambulatory Visit: Payer: Self-pay

## 2022-06-07 ENCOUNTER — Inpatient Hospital Stay (HOSPITAL_COMMUNITY)
Admission: EM | Admit: 2022-06-07 | Discharge: 2022-06-10 | DRG: 871 | Disposition: A | Discharge status: Home / Self Care | Payer: Medicare (Managed Care) | Attending: Student | Admitting: Student

## 2022-06-07 DIAGNOSIS — E876 Hypokalemia: Secondary | ICD-10-CM | POA: Diagnosis present

## 2022-06-07 DIAGNOSIS — R7401 Elevation of levels of liver transaminase levels: Secondary | ICD-10-CM | POA: Diagnosis present

## 2022-06-07 DIAGNOSIS — A419 Sepsis, unspecified organism: Principal | ICD-10-CM | POA: Diagnosis present

## 2022-06-07 DIAGNOSIS — K219 Gastro-esophageal reflux disease without esophagitis: Secondary | ICD-10-CM | POA: Diagnosis not present

## 2022-06-07 DIAGNOSIS — R030 Elevated blood-pressure reading, without diagnosis of hypertension: Secondary | ICD-10-CM | POA: Diagnosis present

## 2022-06-07 DIAGNOSIS — Z7982 Long term (current) use of aspirin: Secondary | ICD-10-CM

## 2022-06-07 DIAGNOSIS — J189 Pneumonia, unspecified organism: Secondary | ICD-10-CM | POA: Diagnosis not present

## 2022-06-07 DIAGNOSIS — I7143 Infrarenal abdominal aortic aneurysm, without rupture: Secondary | ICD-10-CM | POA: Diagnosis not present

## 2022-06-07 DIAGNOSIS — E785 Hyperlipidemia, unspecified: Secondary | ICD-10-CM | POA: Diagnosis present

## 2022-06-07 DIAGNOSIS — I714 Abdominal aortic aneurysm, without rupture, unspecified: Secondary | ICD-10-CM | POA: Diagnosis present

## 2022-06-07 DIAGNOSIS — R911 Solitary pulmonary nodule: Secondary | ICD-10-CM | POA: Diagnosis present

## 2022-06-07 DIAGNOSIS — G9341 Metabolic encephalopathy: Secondary | ICD-10-CM | POA: Diagnosis present

## 2022-06-07 DIAGNOSIS — Z1152 Encounter for screening for COVID-19: Secondary | ICD-10-CM

## 2022-06-07 DIAGNOSIS — E782 Mixed hyperlipidemia: Secondary | ICD-10-CM | POA: Diagnosis not present

## 2022-06-07 DIAGNOSIS — M539 Dorsopathy, unspecified: Secondary | ICD-10-CM | POA: Diagnosis present

## 2022-06-07 DIAGNOSIS — D72819 Decreased white blood cell count, unspecified: Secondary | ICD-10-CM | POA: Diagnosis present

## 2022-06-07 DIAGNOSIS — R6520 Severe sepsis without septic shock: Secondary | ICD-10-CM | POA: Diagnosis present

## 2022-06-07 DIAGNOSIS — Y92410 Unspecified street and highway as the place of occurrence of the external cause: Secondary | ICD-10-CM | POA: Diagnosis not present

## 2022-06-07 DIAGNOSIS — Z96611 Presence of right artificial shoulder joint: Secondary | ICD-10-CM | POA: Diagnosis present

## 2022-06-07 DIAGNOSIS — Z79899 Other long term (current) drug therapy: Secondary | ICD-10-CM

## 2022-06-07 DIAGNOSIS — Z791 Long term (current) use of non-steroidal anti-inflammatories (NSAID): Secondary | ICD-10-CM

## 2022-06-07 DIAGNOSIS — R4182 Altered mental status, unspecified: Secondary | ICD-10-CM

## 2022-06-07 LAB — CBC WITH DIFFERENTIAL/PLATELET
Abs Immature Granulocytes: 0.01 10*3/uL (ref 0.00–0.07)
Basophils Absolute: 0 10*3/uL (ref 0.0–0.1)
Basophils Relative: 1 %
Eosinophils Absolute: 0 10*3/uL (ref 0.0–0.5)
Eosinophils Relative: 0 %
HCT: 35.8 % — ABNORMAL LOW (ref 39.0–52.0)
Hemoglobin: 12.3 g/dL — ABNORMAL LOW (ref 13.0–17.0)
Immature Granulocytes: 0 %
Lymphocytes Relative: 20 %
Lymphs Abs: 0.7 10*3/uL (ref 0.7–4.0)
MCH: 33.6 pg (ref 26.0–34.0)
MCHC: 34.4 g/dL (ref 30.0–36.0)
MCV: 97.8 fL (ref 80.0–100.0)
Monocytes Absolute: 0.4 10*3/uL (ref 0.1–1.0)
Monocytes Relative: 10 %
Neutro Abs: 2.5 10*3/uL (ref 1.7–7.7)
Neutrophils Relative %: 69 %
Platelets: 166 10*3/uL (ref 150–400)
RBC: 3.66 MIL/uL — ABNORMAL LOW (ref 4.22–5.81)
RDW: 13.2 % (ref 11.5–15.5)
WBC: 3.7 10*3/uL — ABNORMAL LOW (ref 4.0–10.5)
nRBC: 0 % (ref 0.0–0.2)

## 2022-06-07 LAB — URINALYSIS, ROUTINE W REFLEX MICROSCOPIC
Bilirubin Urine: NEGATIVE
Glucose, UA: NEGATIVE mg/dL
Hgb urine dipstick: NEGATIVE
Ketones, ur: 20 mg/dL — AB
Leukocytes,Ua: NEGATIVE
Nitrite: NEGATIVE
Protein, ur: NEGATIVE mg/dL
Specific Gravity, Urine: 1.017 (ref 1.005–1.030)
pH: 6 (ref 5.0–8.0)

## 2022-06-07 LAB — COMPREHENSIVE METABOLIC PANEL
ALT: 19 U/L (ref 0–44)
AST: 50 U/L — ABNORMAL HIGH (ref 15–41)
Albumin: 3.4 g/dL — ABNORMAL LOW (ref 3.5–5.0)
Alkaline Phosphatase: 81 U/L (ref 38–126)
Anion gap: 8 (ref 5–15)
BUN: 15 mg/dL (ref 8–23)
CO2: 23 mmol/L (ref 22–32)
Calcium: 8.7 mg/dL — ABNORMAL LOW (ref 8.9–10.3)
Chloride: 108 mmol/L (ref 98–111)
Creatinine, Ser: 1.17 mg/dL (ref 0.61–1.24)
GFR, Estimated: >60 mL/min (ref >60)
Glucose, Bld: 95 mg/dL (ref 70–99)
Potassium: 4.1 mmol/L (ref 3.5–5.1)
Sodium: 139 mmol/L (ref 135–145)
Total Bilirubin: 1.4 mg/dL — ABNORMAL HIGH (ref 0.3–1.2)
Total Protein: 5.9 g/dL — ABNORMAL LOW (ref 6.5–8.1)

## 2022-06-07 LAB — PROTIME-INR
INR: 1 (ref 0.8–1.2)
Prothrombin Time: 13.5 seconds (ref 11.4–15.2)

## 2022-06-07 LAB — RAPID URINE DRUG SCREEN, HOSP PERFORMED
Amphetamines: NOT DETECTED
Barbiturates: NOT DETECTED
Benzodiazepines: NOT DETECTED
Cocaine: NOT DETECTED
Opiates: NOT DETECTED
Tetrahydrocannabinol: NOT DETECTED

## 2022-06-07 LAB — RESP PANEL BY RT-PCR (RSV, FLU A&B, COVID)  RVPGX2
Influenza A by PCR: NEGATIVE
Influenza B by PCR: NEGATIVE
Resp Syncytial Virus by PCR: NEGATIVE
SARS Coronavirus 2 by RT PCR: NEGATIVE

## 2022-06-07 LAB — APTT: aPTT: 25 seconds (ref 24–36)

## 2022-06-07 LAB — ETHANOL: Alcohol, Ethyl (B): <10 mg/dL (ref <10)

## 2022-06-07 MED ORDER — ACETAMINOPHEN 650 MG RE SUPP: 650.0000 mg | Freq: Four times a day (QID) | RECTAL | Status: DC | PRN

## 2022-06-07 MED ORDER — IOHEXOL 350 MG/ML SOLN
75.0000 mL | Freq: Once | INTRAVENOUS | Status: AC | PRN
  Administered 2022-06-07: 75 mL via INTRAVENOUS

## 2022-06-07 MED ORDER — SODIUM CHLORIDE 0.9 % IV SOLN
500.0000 mg | Freq: Once | INTRAVENOUS | Status: AC
  Administered 2022-06-07: 500 mg via INTRAVENOUS
  Filled 2022-06-07: qty 5

## 2022-06-07 MED ORDER — SODIUM CHLORIDE 0.9 % IV SOLN
1.0000 g | Freq: Once | INTRAVENOUS | Status: AC
  Administered 2022-06-07: 1 g via INTRAVENOUS
  Filled 2022-06-07: qty 10

## 2022-06-07 MED ORDER — ALBUTEROL SULFATE (2.5 MG/3ML) 0.083% IN NEBU: 2.5000 mg | INHALATION_SOLUTION | RESPIRATORY_TRACT | Status: DC | PRN

## 2022-06-07 MED ORDER — MELATONIN 3 MG PO TABS: 3.0000 mg | ORAL_TABLET | Freq: Every evening | ORAL | Status: DC | PRN

## 2022-06-07 MED ORDER — SODIUM CHLORIDE 0.9 % IV SOLN
1.0000 g | INTRAVENOUS | Status: DC
  Administered 2022-06-08 – 2022-06-09 (×2): 1 g via INTRAVENOUS
  Filled 2022-06-07 (×3): qty 10

## 2022-06-07 MED ORDER — SODIUM CHLORIDE 0.9 % IV SOLN
500.0000 mg | INTRAVENOUS | Status: DC
  Administered 2022-06-08: 500 mg via INTRAVENOUS
  Filled 2022-06-07: qty 5

## 2022-06-07 MED ORDER — ONDANSETRON HCL 4 MG/2ML IJ SOLN: 4.0000 mg | Freq: Four times a day (QID) | INTRAMUSCULAR | Status: DC | PRN

## 2022-06-07 MED ORDER — ACETAMINOPHEN 325 MG PO TABS
650.0000 mg | ORAL_TABLET | Freq: Four times a day (QID) | ORAL | Status: DC | PRN
  Administered 2022-06-08 – 2022-06-10 (×3): 650 mg via ORAL
  Filled 2022-06-07 (×3): qty 2
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
## 2022-06-07 NOTE — ED Notes (Signed)
Paul Bauer Son [phone]
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
## 2022-06-07 NOTE — H&P (Signed)
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 Plan: Per Rounding Team Consults called: none***;  Admission status: ***     I SPENT GREATER THAN 75 *** MINUTES IN CLINICAL CARE TIME/MEDICAL DECISION-MAKING IN COMPLETING THIS ADMISSION.      Chaney Born Misheel Gowans DO [facility]  From 7PM - 7AM   06/07/2022, 11:24 PM   ***

## 2022-06-07 NOTE — Discharge Instructions (Signed)
You will need to follow-up with your PCP in the next week to have a repeat chest x-ray.  You have a rounded opacity on chest x-ray that may represent a pneumonia but this is not definitive.  You also have the appearance of an abdominal aortic aneurysm.  This does not appear to rupture undergoing treatment for repair at this time but will require repeat imaging in approximately 3 years.  Please discuss this finding with your primary care provider.  I have referred you to neurology for evaluation of possible dementia.  They should call you to schedule a follow-up appointment within 72 hours.  If you do not hear from them, please contact their office to schedule a follow-up appointment.

## 2022-06-07 NOTE — ED Notes (Signed)
Ambulated pt at this time. PA at bedside. Pt unable to walk on a steady gait by himself. Needed assistance and pt was swaying back when seated on the side of the bed. Pt is alert with some confusion noted. Provider is aware. Assisted back to his bed. Cardiac monitoring in place.

## 2022-06-07 NOTE — ED Notes (Signed)
Family at bedside. 

## 2022-06-07 NOTE — ED Notes (Signed)
[phone] pt daughter called for a update

## 2022-06-08 ENCOUNTER — Encounter (HOSPITAL_COMMUNITY): Payer: Self-pay | Admitting: Internal Medicine

## 2022-06-08 DIAGNOSIS — A419 Sepsis, unspecified organism: Secondary | ICD-10-CM | POA: Diagnosis present

## 2022-06-08 DIAGNOSIS — N1831 Chronic kidney disease, stage 3a: Secondary | ICD-10-CM | POA: Insufficient documentation

## 2022-06-08 DIAGNOSIS — E785 Hyperlipidemia, unspecified: Secondary | ICD-10-CM | POA: Diagnosis present

## 2022-06-08 DIAGNOSIS — J189 Pneumonia, unspecified organism: Secondary | ICD-10-CM | POA: Diagnosis present

## 2022-06-08 DIAGNOSIS — G9341 Metabolic encephalopathy: Secondary | ICD-10-CM | POA: Diagnosis not present

## 2022-06-08 DIAGNOSIS — Z8711 Personal history of peptic ulcer disease: Secondary | ICD-10-CM | POA: Insufficient documentation

## 2022-06-08 DIAGNOSIS — R911 Solitary pulmonary nodule: Secondary | ICD-10-CM | POA: Diagnosis not present

## 2022-06-08 DIAGNOSIS — K219 Gastro-esophageal reflux disease without esophagitis: Secondary | ICD-10-CM | POA: Diagnosis present

## 2022-06-08 DIAGNOSIS — I714 Abdominal aortic aneurysm, without rupture, unspecified: Secondary | ICD-10-CM | POA: Diagnosis present

## 2022-06-08 DIAGNOSIS — E782 Mixed hyperlipidemia: Secondary | ICD-10-CM | POA: Diagnosis not present

## 2022-06-08 DIAGNOSIS — R6520 Severe sepsis without septic shock: Secondary | ICD-10-CM | POA: Diagnosis present

## 2022-06-08 LAB — CBC WITH DIFFERENTIAL/PLATELET
Abs Immature Granulocytes: 0.01 10*3/uL (ref 0.00–0.07)
Basophils Absolute: 0 10*3/uL (ref 0.0–0.1)
Basophils Relative: 1 %
Eosinophils Absolute: 0.1 10*3/uL (ref 0.0–0.5)
Eosinophils Relative: 3 %
HCT: 36.9 % — ABNORMAL LOW (ref 39.0–52.0)
Hemoglobin: 12.7 g/dL — ABNORMAL LOW (ref 13.0–17.0)
Immature Granulocytes: 0 %
Lymphocytes Relative: 24 %
Lymphs Abs: 0.8 10*3/uL (ref 0.7–4.0)
MCH: 33.8 pg (ref 26.0–34.0)
MCHC: 34.4 g/dL (ref 30.0–36.0)
MCV: 98.1 fL (ref 80.0–100.0)
Monocytes Absolute: 0.3 10*3/uL (ref 0.1–1.0)
Monocytes Relative: 9 %
Neutro Abs: 2 10*3/uL (ref 1.7–7.7)
Neutrophils Relative %: 63 %
Platelets: 150 10*3/uL (ref 150–400)
RBC: 3.76 MIL/uL — ABNORMAL LOW (ref 4.22–5.81)
RDW: 13 % (ref 11.5–15.5)
WBC: 3.2 10*3/uL — ABNORMAL LOW (ref 4.0–10.5)
nRBC: 0 % (ref 0.0–0.2)

## 2022-06-08 LAB — COMPREHENSIVE METABOLIC PANEL
ALT: 24 U/L (ref 0–44)
AST: 43 U/L — ABNORMAL HIGH (ref 15–41)
Albumin: 3.2 g/dL — ABNORMAL LOW (ref 3.5–5.0)
Alkaline Phosphatase: 87 U/L (ref 38–126)
Anion gap: 11 (ref 5–15)
BUN: 11 mg/dL (ref 8–23)
CO2: 22 mmol/L (ref 22–32)
Calcium: 8.7 mg/dL — ABNORMAL LOW (ref 8.9–10.3)
Chloride: 102 mmol/L (ref 98–111)
Creatinine, Ser: 1.11 mg/dL (ref 0.61–1.24)
GFR, Estimated: >60 mL/min (ref >60)
Glucose, Bld: 82 mg/dL (ref 70–99)
Potassium: 3.4 mmol/L — ABNORMAL LOW (ref 3.5–5.1)
Sodium: 135 mmol/L (ref 135–145)
Total Bilirubin: 0.9 mg/dL (ref 0.3–1.2)
Total Protein: 6.1 g/dL — ABNORMAL LOW (ref 6.5–8.1)

## 2022-06-08 LAB — I-STAT VENOUS BLOOD GAS, ED
Acid-base deficit: 1 mmol/L (ref 0.0–2.0)
Bicarbonate: 22.6 mmol/L (ref 20.0–28.0)
Calcium, Ion: 1.2 mmol/L (ref 1.15–1.40)
HCT: 35 % — ABNORMAL LOW (ref 39.0–52.0)
Hemoglobin: 11.9 g/dL — ABNORMAL LOW (ref 13.0–17.0)
O2 Saturation: 71 %
Potassium: 3.6 mmol/L (ref 3.5–5.1)
Sodium: 138 mmol/L (ref 135–145)
TCO2: 24 mmol/L (ref 22–32)
pCO2, Ven: 35.3 mmHg — ABNORMAL LOW (ref 44–60)
pH, Ven: 7.416 (ref 7.25–7.43)
pO2, Ven: 37 mmHg (ref 32–45)

## 2022-06-08 LAB — TSH: TSH: 1.17 u[IU]/mL (ref 0.350–4.500)

## 2022-06-08 LAB — CK: Total CK: 844 U/L — ABNORMAL HIGH (ref 49–397)

## 2022-06-08 LAB — MAGNESIUM: Magnesium: 1.8 mg/dL (ref 1.7–2.4)

## 2022-06-08 LAB — BRAIN NATRIURETIC PEPTIDE: B Natriuretic Peptide: 97.8 pg/mL (ref 0.0–100.0)

## 2022-06-08 LAB — PROCALCITONIN: Procalcitonin: <0.1 ng/mL

## 2022-06-08 LAB — LACTIC ACID, PLASMA: Lactic Acid, Venous: 0.9 mmol/L (ref 0.5–1.9)

## 2022-06-08 LAB — AMMONIA: Ammonia: 20 umol/L (ref 9–35)

## 2022-06-08 MED ORDER — ATORVASTATIN CALCIUM 40 MG PO TABS
40.0000 mg | ORAL_TABLET | Freq: Every day | ORAL | Status: DC
  Administered 2022-06-08 – 2022-06-10 (×3): 40 mg via ORAL
  Filled 2022-06-08 (×3): qty 1

## 2022-06-08 MED ORDER — PANTOPRAZOLE SODIUM 40 MG PO TBEC
40.0000 mg | DELAYED_RELEASE_TABLET | Freq: Every day | ORAL | Status: DC
  Administered 2022-06-08 – 2022-06-10 (×3): 40 mg via ORAL
  Filled 2022-06-08 (×3): qty 1

## 2022-06-08 MED ORDER — AMLODIPINE BESYLATE 10 MG PO TABS
10.0000 mg | ORAL_TABLET | Freq: Every day | ORAL | Status: DC
  Administered 2022-06-08 – 2022-06-10 (×3): 10 mg via ORAL
  Filled 2022-06-08: qty 2
  Filled 2022-06-08 (×2): qty 1

## 2022-06-08 MED ORDER — HYDRALAZINE HCL 25 MG PO TABS: 25.0000 mg | ORAL_TABLET | Freq: Four times a day (QID) | ORAL | Status: DC | PRN

## 2022-06-08 MED ORDER — BENZONATATE 100 MG PO CAPS: 200.0000 mg | ORAL_CAPSULE | Freq: Three times a day (TID) | ORAL | Status: DC | PRN
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
## 2022-06-08 NOTE — ED Notes (Signed)
Patient currently eating at this time, no s/s of any distress. Will continue to monitor
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
## 2022-06-09 DIAGNOSIS — A419 Sepsis, unspecified organism: Secondary | ICD-10-CM | POA: Diagnosis not present

## 2022-06-09 DIAGNOSIS — E782 Mixed hyperlipidemia: Secondary | ICD-10-CM | POA: Diagnosis not present

## 2022-06-09 DIAGNOSIS — R911 Solitary pulmonary nodule: Secondary | ICD-10-CM | POA: Diagnosis not present

## 2022-06-09 DIAGNOSIS — G9341 Metabolic encephalopathy: Secondary | ICD-10-CM | POA: Diagnosis not present

## 2022-06-09 LAB — PHOSPHORUS: Phosphorus: 2.7 mg/dL (ref 2.5–4.6)

## 2022-06-09 LAB — COMPREHENSIVE METABOLIC PANEL
ALT: 25 U/L (ref 0–44)
AST: 40 U/L (ref 15–41)
Albumin: 3.3 g/dL — ABNORMAL LOW (ref 3.5–5.0)
Alkaline Phosphatase: 95 U/L (ref 38–126)
Anion gap: 11 (ref 5–15)
BUN: 16 mg/dL (ref 8–23)
CO2: 23 mmol/L (ref 22–32)
Calcium: 8.9 mg/dL (ref 8.9–10.3)
Chloride: 102 mmol/L (ref 98–111)
Creatinine, Ser: 1.08 mg/dL (ref 0.61–1.24)
GFR, Estimated: >60 mL/min (ref >60)
Glucose, Bld: 98 mg/dL (ref 70–99)
Potassium: 3.4 mmol/L — ABNORMAL LOW (ref 3.5–5.1)
Sodium: 136 mmol/L (ref 135–145)
Total Bilirubin: 0.6 mg/dL (ref 0.3–1.2)
Total Protein: 6.4 g/dL — ABNORMAL LOW (ref 6.5–8.1)

## 2022-06-09 LAB — CBC WITH DIFFERENTIAL/PLATELET
Abs Immature Granulocytes: 0.01 10*3/uL (ref 0.00–0.07)
Basophils Absolute: 0 10*3/uL (ref 0.0–0.1)
Basophils Relative: 1 %
Eosinophils Absolute: 0.2 10*3/uL (ref 0.0–0.5)
Eosinophils Relative: 5 %
HCT: 39.2 % (ref 39.0–52.0)
Hemoglobin: 13.7 g/dL (ref 13.0–17.0)
Immature Granulocytes: 0 %
Lymphocytes Relative: 27 %
Lymphs Abs: 0.9 10*3/uL (ref 0.7–4.0)
MCH: 33.2 pg (ref 26.0–34.0)
MCHC: 34.9 g/dL (ref 30.0–36.0)
MCV: 94.9 fL (ref 80.0–100.0)
Monocytes Absolute: 0.3 10*3/uL (ref 0.1–1.0)
Monocytes Relative: 10 %
Neutro Abs: 2 10*3/uL (ref 1.7–7.7)
Neutrophils Relative %: 57 %
Platelets: 151 10*3/uL (ref 150–400)
RBC: 4.13 MIL/uL — ABNORMAL LOW (ref 4.22–5.81)
RDW: 12.7 % (ref 11.5–15.5)
WBC: 3.4 10*3/uL — ABNORMAL LOW (ref 4.0–10.5)
nRBC: 0 % (ref 0.0–0.2)

## 2022-06-09 LAB — MAGNESIUM: Magnesium: 1.9 mg/dL (ref 1.7–2.4)

## 2022-06-09 LAB — CK: Total CK: 495 U/L — ABNORMAL HIGH (ref 49–397)

## 2022-06-09 MED ORDER — AZITHROMYCIN 250 MG PO TABS
500.0000 mg | ORAL_TABLET | Freq: Every day | ORAL | Status: DC
  Administered 2022-06-09: 500 mg via ORAL
  Filled 2022-06-09: qty 2

## 2022-06-09 MED ORDER — POTASSIUM CHLORIDE CRYS ER 20 MEQ PO TBCR
40.0000 meq | EXTENDED_RELEASE_TABLET | Freq: Once | ORAL | Status: AC
  Administered 2022-06-09: 40 meq via ORAL
  Filled 2022-06-09: qty 2
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
## 2022-06-09 NOTE — Progress Notes (Signed)
   06/09/22 1000  Mobility  Activity Ambulated with assistance in hallway  Level of Assistance Contact guard assist, steadying assist  Assistive Device Front wheel walker  Distance Ambulated (ft) 250 ft  RUE Weight Bearing WBAT  Activity Response Tolerated well  Mobility Referral Yes  $Mobility charge 1 Mobility   Mobility Specialist Progress Note  Pt was in chair and agreeable. Had no c/o pain. Returned to chair w/ call bell in reach and alarm on.  Anastasia Pall Mobility Specialist  Please contact via SecureChat or Rehab office at [phone]
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
## 2022-06-10 DIAGNOSIS — R911 Solitary pulmonary nodule: Secondary | ICD-10-CM | POA: Diagnosis not present

## 2022-06-10 DIAGNOSIS — A419 Sepsis, unspecified organism: Secondary | ICD-10-CM | POA: Diagnosis not present

## 2022-06-10 DIAGNOSIS — G9341 Metabolic encephalopathy: Secondary | ICD-10-CM | POA: Diagnosis not present

## 2022-06-10 LAB — MAGNESIUM: Magnesium: 2.2 mg/dL (ref 1.7–2.4)

## 2022-06-10 LAB — CBC WITH DIFFERENTIAL/PLATELET
Abs Immature Granulocytes: 0.01 10*3/uL (ref 0.00–0.07)
Basophils Absolute: 0 10*3/uL (ref 0.0–0.1)
Basophils Relative: 1 %
Eosinophils Absolute: 0.2 10*3/uL (ref 0.0–0.5)
Eosinophils Relative: 6 %
HCT: 37.8 % — ABNORMAL LOW (ref 39.0–52.0)
Hemoglobin: 12.8 g/dL — ABNORMAL LOW (ref 13.0–17.0)
Immature Granulocytes: 0 %
Lymphocytes Relative: 31 %
Lymphs Abs: 1.3 10*3/uL (ref 0.7–4.0)
MCH: 32.7 pg (ref 26.0–34.0)
MCHC: 33.9 g/dL (ref 30.0–36.0)
MCV: 96.7 fL (ref 80.0–100.0)
Monocytes Absolute: 0.4 10*3/uL (ref 0.1–1.0)
Monocytes Relative: 9 %
Neutro Abs: 2.2 10*3/uL (ref 1.7–7.7)
Neutrophils Relative %: 53 %
Platelets: 144 10*3/uL — ABNORMAL LOW (ref 150–400)
RBC: 3.91 MIL/uL — ABNORMAL LOW (ref 4.22–5.81)
RDW: 12.8 % (ref 11.5–15.5)
WBC: 3.8 10*3/uL — ABNORMAL LOW (ref 4.0–10.5)
nRBC: 0 % (ref 0.0–0.2)

## 2022-06-10 LAB — RENAL FUNCTION PANEL
Albumin: 3 g/dL — ABNORMAL LOW (ref 3.5–5.0)
Anion gap: 6 (ref 5–15)
BUN: 22 mg/dL (ref 8–23)
CO2: 23 mmol/L (ref 22–32)
Calcium: 9 mg/dL (ref 8.9–10.3)
Chloride: 109 mmol/L (ref 98–111)
Creatinine, Ser: 1.22 mg/dL (ref 0.61–1.24)
GFR, Estimated: 60 mL/min — ABNORMAL LOW (ref >60)
Glucose, Bld: 106 mg/dL — ABNORMAL HIGH (ref 70–99)
Phosphorus: 3.4 mg/dL (ref 2.5–4.6)
Potassium: 4.2 mmol/L (ref 3.5–5.1)
Sodium: 138 mmol/L (ref 135–145)

## 2022-06-10 MED ORDER — AMOXICILLIN-POT CLAVULANATE 875-125 MG PO TABS: 1.0000 | ORAL_TABLET | Freq: Two times a day (BID) | ORAL | 0 refills | Status: AC

## 2022-06-10 MED ORDER — AMLODIPINE BESYLATE 10 MG PO TABS: 10.0000 mg | ORAL_TABLET | Freq: Every day | ORAL | 1 refills | Status: AC

## 2022-06-10 MED ORDER — AZITHROMYCIN 250 MG PO TABS: 250.0000 mg | ORAL_TABLET | Freq: Every day | ORAL | 0 refills | Status: AC

## 2022-06-10 NOTE — Care Management Important Message (Signed)
Important Message  Patient Details  Name: Paul Bauer MRN: [id number] Date of Birth: May 15, 1940   Medicare Important Message Given:  Yes     Sherilyn Banker 06/10/2022, 1:21 PM
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
## 2022-06-10 NOTE — TOC Transition Note (Signed)
Transition of Care [facility]) - CM/SW Discharge Note   Patient Details  Name: Paul Bauer MRN: [id number] Date of Birth: Sep 09, 1940  Transition of Care [facility]) CM/SW Contact:  Carley Hammed, LCSWA Phone Number: 06/10/2022, 2:58 PM   Clinical Narrative:    Pt agreeable to discharging into family care, going to dtr's address [address] [place] [address]. CM set up pt with [facility], and ordered a rolling walker. CSW left VM with DSS to update APS about disposition. TOC will sign off at this time.    Final next level of care: Home w Home Health Services Barriers to Discharge: Barriers Resolved   Patient Goals and CMS Choice   Choice offered to / list presented to : Adult Children  Discharge Placement                Patient chooses bed at:  (Daughters  home) Patient to be transferred to facility by: Family Name of family member notified: Children Patient and family notified of of transfer: 06/10/22  Discharge Plan and Services Additional resources added to the After Visit Summary for                  DME Arranged: Walker rolling DME Agency: [facility] Date DME Agency Contacted: 06/10/22 Time DME Agency Contacted: [phone] Representative spoke with at DME Agency: Belenda Cruise HH Arranged: PT, OT HH Agency: [facility] Date [facility] Agency Contacted: 06/10/22 Time HH Agency Contacted: 1457 Representative spoke with at [facility] Agency: Denyse Amass  Social Determinants of Health (SDOH) Interventions SDOH Screenings   Food Insecurity: No Food Insecurity (06/08/2022)  Housing: Low Risk  (06/08/2022)  Transportation Needs: No Transportation Needs (06/08/2022)  Utilities: Not At Risk (06/08/2022)  Tobacco Use: Low Risk  (06/08/2022)     Readmission Risk Interventions     No data to display
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
## 2022-06-10 NOTE — Discharge Summary (Signed)
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 origin, L5 on S1. No other spondylolisthesis. Vertebrae: No acute fracture or focal pathologic process. Paraspinal and other soft tissues: No paraspinal mass, edema or hemorrhage. Bilateral nonobstructing intrarenal stones. Aortic atherosclerosis. Focal dilation of the infrarenal abdominal aorta to 3 cm. Disc levels: Moderate loss of disc height at L1-L2. Remaining lumbar disc spaces relatively well preserved. Moderate to marked bilateral facet degenerative change at L5-S1. Moderate facet degenerative change at L4-L5. No significant disc bulging or evidence of disc herniation. Central spinal canal is relatively well preserved. Moderate right and mild left neural foraminal narrowing at L5-S1. IMPRESSION: CT THORACIC SPINE IMPRESSION 1. No fracture or acute finding. 2. Mild dextroscoliosis.  Disc degenerative changes. 3. Rounded opacity at the right lung base suspected to be rounded atelectasis. Pneumonia or even a mass is not excluded, however. This was not present on the CT from 07/02/2019. Recommend follow-up chest  radiographs, PA and lateral. CT LUMBAR SPINE IMPRESSION 1. No fracture or acute finding. 2. Disc degenerative changes at L1-L2. Lower lumbar spine facet degenerative changes most evident at L5-S1 with there is a grade 1 anterolisthesis. 3. Bilateral intrarenal stones. 4. 3 cm infrarenal abdominal aortic aneurysm increased from 2.6 cm on the previous CT. Recommend follow-up ultrasound every 3 years. This recommendation follows ACR consensus guidelines: White Paper of the ACR Incidental Findings Committee II on Vascular Findings. J Am Coll Radiol 2013; 10:789-794. Electronically Signed   By: Amie Portland M.D.   On: 06/07/2022 13:22   DG Shoulder Right Port  Result Date: 05/20/2022 CLINICAL DATA:  History are subtle right shoulder arthroplasty. EXAM: RIGHT SHOULDER - 1 VIEW COMPARISON:  CT right shoulder 04/17/2022 FINDINGS: Interval reverse total right shoulder arthroplasty. No perihardware lucency is seen to indicate hardware failure or loosening on single frontal view. Expected postoperative subcutaneous air. Normal alignment of the acromioclavicular joint. No acute fracture or dislocation. Moderate multilevel degenerative disc changes of the thoracic spine with mild-to-moderate dextrocurvature of the mid to lower thoracic spine. IMPRESSION: Interval reverse total right shoulder arthroplasty without complicating features. Electronically Signed   By: Neita Garnet M.D.   On: 05/20/2022  13:37       The results of significant diagnostics from this hospitalization (including imaging, microbiology, ancillary and laboratory) are listed below for reference.     Microbiology: Recent Results (from the past 240 hour(s))  Resp panel by RT-PCR (RSV, Flu A&B, Covid) Anterior Nasal Swab     Status: None   Collection Time: 06/07/22  5:10 PM   Specimen: Anterior Nasal Swab  Result Value Ref Range Status   SARS Coronavirus 2 by RT PCR NEGATIVE NEGATIVE Final   Influenza A by PCR NEGATIVE NEGATIVE Final    Influenza B by PCR NEGATIVE NEGATIVE Final    Comment: (NOTE) The Xpert Xpress SARS-CoV-2/FLU/RSV plus assay is intended as an aid in the diagnosis of influenza from Nasopharyngeal swab specimens and should not be used as a sole basis for treatment. Nasal washings and aspirates are unacceptable for Xpert Xpress SARS-CoV-2/FLU/RSV testing.  Fact Sheet for Patients: BloggerCourse.com  Fact Sheet for Healthcare Providers: SeriousBroker.it  This test is not yet approved or cleared by the [place] FDA and has been authorized for detection and/or diagnosis of SARS-CoV-2 by FDA under an Emergency Use Authorization (EUA). This EUA will remain in effect (meaning this test can be used) for the duration of the COVID-19 declaration under Section 564(b)(1) of the Act, 21 U.S.C. section 360bbb-3(b)(1), unless the authorization is terminated or revoked.     Resp Syncytial Virus by PCR NEGATIVE NEGATIVE Final    Comment: (NOTE) Fact Sheet for Patients: BloggerCourse.com  Fact Sheet for Healthcare Providers: SeriousBroker.it  This test is not yet approved or cleared by the [place] FDA and has been authorized for detection and/or diagnosis of SARS-CoV-2 by FDA under an Emergency Use Authorization (EUA). This EUA will remain in effect (meaning this test can be used) for the duration of the COVID-19 declaration under Section 564(b)(1) of the Act, 21 U.S.C. section 360bbb-3(b)(1), unless the authorization is terminated or revoked.  Performed at [facility], [address] [place], [place] [address]      Labs:  CBC: Recent Labs  Lab 06/07/22 1329 06/08/22 0321 06/08/22 0332 06/09/22 0116 06/10/22 0139  WBC 3.7* 3.2*  --  3.4* 3.8*  NEUTROABS 2.5 2.0  --  2.0 2.2  HGB 12.3* 12.7* 11.9* 13.7 12.8*  HCT 35.8* 36.9* 35.0* 39.2 37.8*  MCV 97.8 98.1  --  94.9 96.7  PLT  166 150  --  151 144*   BMP &GFR Recent Labs  Lab 06/07/22 1329 06/08/22 0321 06/08/22 0332 06/09/22 0116 06/10/22 0139  NA 139 135 138 136 138  K 4.1 3.4* 3.6 3.4* 4.2  CL 108 102  --  102 109  CO2 23 22  --  23 23  GLUCOSE 95 82  --  98 106*  BUN 15 11  --  16 22  CREATININE 1.17 1.11  --  1.08 1.22  CALCIUM 8.7* 8.7*  --  8.9 9.0  MG  --  1.8  --  1.9 2.2  PHOS  --   --   --  2.7 3.4   Estimated Creatinine Clearance: 43.5 mL/min (by C-G formula based on SCr of 1.22 mg/dL). Liver & Pancreas: Recent Labs  Lab 06/07/22 1329 06/08/22 0321 06/09/22 0116 06/10/22 0139  AST 50* 43* 40  --   ALT 19 24 25   --   ALKPHOS 81 87 95  --   BILITOT 1.4* 0.9 0.6  --   PROT 5.9* 6.1* 6.4*  --  ALBUMIN 3.4* 3.2* 3.3* 3.0*   No results for input(s): "LIPASE", "AMYLASE" in the last 168 hours. Recent Labs  Lab 06/08/22 0321  AMMONIA 20   Diabetic: No results for input(s): "HGBA1C" in the last 72 hours. No results for input(s): "GLUCAP" in the last 168 hours. Cardiac Enzymes: Recent Labs  Lab 06/08/22 0321 06/09/22 0116  CKTOTAL 844* 495*   No results for input(s): "PROBNP" in the last 8760 hours. Coagulation Profile: Recent Labs  Lab 06/07/22 1329  INR 1.0   Thyroid Function Tests: Recent Labs    06/08/22 0321  TSH 1.170   Lipid Profile: No results for input(s): "CHOL", "HDL", "LDLCALC", "TRIG", "CHOLHDL", "LDLDIRECT" in the last 72 hours. Anemia Panel: No results for input(s): "VITAMINB12", "FOLATE", "FERRITIN", "TIBC", "IRON", "RETICCTPCT" in the last 72 hours. Urine analysis:    Component Value Date/Time   COLORURINE YELLOW 06/07/2022 1216   APPEARANCEUR CLEAR 06/07/2022 1216   LABSPEC 1.017 06/07/2022 1216   PHURINE 6.0 06/07/2022 1216   GLUCOSEU NEGATIVE 06/07/2022 1216   HGBUR NEGATIVE 06/07/2022 1216   BILIRUBINUR NEGATIVE 06/07/2022 1216   KETONESUR 20 (A) 06/07/2022 1216   PROTEINUR NEGATIVE 06/07/2022 1216   UROBILINOGEN 1.0 01/16/2011 1219    NITRITE NEGATIVE 06/07/2022 1216   LEUKOCYTESUR NEGATIVE 06/07/2022 1216   Sepsis Labs: Invalid input(s): "PROCALCITONIN", "LACTICIDVEN"   SIGNED:  Almon Hercules, MD  [facility] 06/10/2022, 4:16 PM
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
